# Patient Record
Sex: Female | Born: 1965 | Hispanic: No | Marital: Single | Smoking: Never smoker
Health system: Southern US, Community
[De-identification: ages and names within clinical notes are randomized; demographics above are authoritative.]

## PROBLEM LIST (undated history)

## (undated) DIAGNOSIS — F32A Depression, unspecified: Secondary | ICD-10-CM

## (undated) DIAGNOSIS — A64 Unspecified sexually transmitted disease: Secondary | ICD-10-CM

## (undated) DIAGNOSIS — F4321 Adjustment disorder with depressed mood: Secondary | ICD-10-CM

## (undated) DIAGNOSIS — T8859XA Other complications of anesthesia, initial encounter: Secondary | ICD-10-CM

## (undated) DIAGNOSIS — F419 Anxiety disorder, unspecified: Secondary | ICD-10-CM

## (undated) DIAGNOSIS — M549 Dorsalgia, unspecified: Secondary | ICD-10-CM

## (undated) DIAGNOSIS — M545 Low back pain, unspecified: Secondary | ICD-10-CM

## (undated) DIAGNOSIS — M47816 Spondylosis without myelopathy or radiculopathy, lumbar region: Secondary | ICD-10-CM

## (undated) DIAGNOSIS — G8929 Other chronic pain: Secondary | ICD-10-CM

## (undated) DIAGNOSIS — R87613 High grade squamous intraepithelial lesion on cytologic smear of cervix (HGSIL): Secondary | ICD-10-CM

## (undated) DIAGNOSIS — E559 Vitamin D deficiency, unspecified: Secondary | ICD-10-CM

## (undated) DIAGNOSIS — F329 Major depressive disorder, single episode, unspecified: Secondary | ICD-10-CM

## (undated) DIAGNOSIS — D649 Anemia, unspecified: Secondary | ICD-10-CM

## (undated) DIAGNOSIS — S52023A Displaced fracture of olecranon process without intraarticular extension of unspecified ulna, initial encounter for closed fracture: Secondary | ICD-10-CM

## (undated) DIAGNOSIS — G43009 Migraine without aura, not intractable, without status migrainosus: Secondary | ICD-10-CM

## (undated) HISTORY — DX: Migraine without aura, not intractable, without status migrainosus: G43.009

## (undated) HISTORY — DX: Major depressive disorder, single episode, unspecified: F32.9

## (undated) HISTORY — PX: INGUINAL HERNIA REPAIR: SUR1180

## (undated) HISTORY — DX: Adjustment disorder with depressed mood: F43.21

## (undated) HISTORY — DX: Unspecified sexually transmitted disease: A64

## (undated) HISTORY — DX: Depression, unspecified: F32.A

## (undated) HISTORY — DX: Vitamin D deficiency, unspecified: E55.9

---

## 2002-10-21 ENCOUNTER — Encounter: Payer: Self-pay | Admitting: Emergency Medicine

## 2002-10-21 ENCOUNTER — Emergency Department (HOSPITAL_COMMUNITY): Admission: EM | Admit: 2002-10-21 | Discharge: 2002-10-21 | Payer: Self-pay | Admitting: Emergency Medicine

## 2011-06-18 ENCOUNTER — Ambulatory Visit (INDEPENDENT_AMBULATORY_CARE_PROVIDER_SITE_OTHER): Payer: BC Managed Care – PPO

## 2011-06-18 DIAGNOSIS — S60229A Contusion of unspecified hand, initial encounter: Secondary | ICD-10-CM

## 2011-06-18 DIAGNOSIS — M25539 Pain in unspecified wrist: Secondary | ICD-10-CM

## 2011-06-27 ENCOUNTER — Ambulatory Visit (INDEPENDENT_AMBULATORY_CARE_PROVIDER_SITE_OTHER): Payer: BC Managed Care – PPO

## 2011-06-27 DIAGNOSIS — J40 Bronchitis, not specified as acute or chronic: Secondary | ICD-10-CM

## 2011-06-27 DIAGNOSIS — B9789 Other viral agents as the cause of diseases classified elsewhere: Secondary | ICD-10-CM

## 2013-10-15 DIAGNOSIS — M533 Sacrococcygeal disorders, not elsewhere classified: Secondary | ICD-10-CM | POA: Insufficient documentation

## 2013-11-05 DIAGNOSIS — G4700 Insomnia, unspecified: Secondary | ICD-10-CM | POA: Insufficient documentation

## 2013-11-05 DIAGNOSIS — M545 Low back pain, unspecified: Secondary | ICD-10-CM | POA: Insufficient documentation

## 2013-11-05 DIAGNOSIS — F329 Major depressive disorder, single episode, unspecified: Secondary | ICD-10-CM | POA: Insufficient documentation

## 2013-11-05 DIAGNOSIS — F32A Depression, unspecified: Secondary | ICD-10-CM | POA: Insufficient documentation

## 2014-02-24 DIAGNOSIS — R202 Paresthesia of skin: Secondary | ICD-10-CM

## 2014-02-24 DIAGNOSIS — L659 Nonscarring hair loss, unspecified: Secondary | ICD-10-CM | POA: Insufficient documentation

## 2014-02-24 DIAGNOSIS — R200 Anesthesia of skin: Secondary | ICD-10-CM | POA: Insufficient documentation

## 2014-02-24 DIAGNOSIS — M47816 Spondylosis without myelopathy or radiculopathy, lumbar region: Secondary | ICD-10-CM | POA: Insufficient documentation

## 2014-03-01 DIAGNOSIS — F4321 Adjustment disorder with depressed mood: Secondary | ICD-10-CM | POA: Insufficient documentation

## 2014-03-01 DIAGNOSIS — N946 Dysmenorrhea, unspecified: Secondary | ICD-10-CM | POA: Insufficient documentation

## 2014-03-01 DIAGNOSIS — M25569 Pain in unspecified knee: Secondary | ICD-10-CM | POA: Insufficient documentation

## 2014-03-01 DIAGNOSIS — G43109 Migraine with aura, not intractable, without status migrainosus: Secondary | ICD-10-CM | POA: Insufficient documentation

## 2014-03-01 DIAGNOSIS — Z7721 Contact with and (suspected) exposure to potentially hazardous body fluids: Secondary | ICD-10-CM | POA: Insufficient documentation

## 2014-03-01 DIAGNOSIS — F418 Other specified anxiety disorders: Secondary | ICD-10-CM | POA: Insufficient documentation

## 2014-03-01 DIAGNOSIS — N959 Unspecified menopausal and perimenopausal disorder: Secondary | ICD-10-CM | POA: Insufficient documentation

## 2014-12-16 ENCOUNTER — Encounter: Payer: Self-pay | Admitting: *Deleted

## 2014-12-17 ENCOUNTER — Encounter: Payer: Self-pay | Admitting: Neurology

## 2014-12-17 ENCOUNTER — Ambulatory Visit (INDEPENDENT_AMBULATORY_CARE_PROVIDER_SITE_OTHER): Payer: BC Managed Care – PPO | Admitting: Neurology

## 2014-12-17 VITALS — BP 108/78 | HR 58 | Ht 68.0 in | Wt 141.4 lb

## 2014-12-17 DIAGNOSIS — G43109 Migraine with aura, not intractable, without status migrainosus: Secondary | ICD-10-CM | POA: Diagnosis not present

## 2014-12-17 NOTE — Patient Instructions (Addendum)
1.  For severe migraines, take ibuprofen in combination with benadryl  2.  Continue zoloft  daily to see if this reduces the frequency of your headaches 3.  I recommend that you get imaging of your brain and blood vessels, either MRI and MRA head and neck or CT/A. 4.  Encouraged to get eyes checked 5.  Return to clinic as needed

## 2014-12-17 NOTE — Progress Notes (Signed)
Note sent

## 2014-12-17 NOTE — Progress Notes (Signed)
[facility] Clinic Note - Initial Visit   Date: 12/17/2014   Pamela [name] MRN: [id number] DOB: 1966/04/11   Dear Dr. Riley NearingAguiar:  Thank you for your kind referral of Pamela [name] for consultation of migraines. Although her history is well known to you, please allow us to reiterate it for the purpose of our medical record. The patient was accompanied to the clinic by self.    History of Present Illness: Pamela [name] is a 49 y.o. right-handed Caucasian female with adjustment disorder with depression and anxiety presenting for evaluation of migraines.    She has a history of migraines which started at the age of 49.  Migraines would occur 1-2 times per year. Since May 2016, she has been getting a migraine each week and lingering headaches. She has aura described as bright light and wavy lines which moves across her visual field to involve both eyes with associated vision loss.  Vision loss lasts about 20-minutes.  There is no pattern to where her headaches are located, she feels like it is the entire head. Pain is described as achy and lasts all day.  Sleep helps alleviate the pain.  She has not noticed any pattern, except the day preceding she gets whole body fatigue and neck stiffness.  She has tried aspirin when her aura starts which does not seem to help.  Headaches are not worse in the morning or with bending, coughing, or sneezing.  She had daily headaches 5 days of the week, which is mild and dull ranked 4/10.  She does not take anything for this headache.  She has no gone to the emergency department this year.  She has missed several days of work.    She had not had any previous MRI and says that she cannot afford it.  CT head in 2004 was normal.  Of note, patient is not forthcoming with her family and social history.  She does not know her drug allergies and says 'you'll need to contact my doctor, it's in their records".  Out-side paper records,  electronic medical record, and images have been reviewed where available and summarized as:  Labs 11/14/2014:  Vitamin B12 796, vitamin D 39.9  CT head 10/22/2002:  NEGATIVE NONCONTRAST HEAD CT.  Past Medical History  Diagnosis Date  . Adjustment disorder with depressed mood   . Migraine without aura and without status migrainosus, not intractable   . Vitamin D deficiency     Past Surgical History  Procedure Laterality Date  . Inguinal hernia repair       Medications:  Current Outpatient Prescriptions on File Prior to Visit  Medication Sig Dispense Refill  . ALPRAZolam (XANAX) 0.25 MG tablet Take 0.25 mg by mouth 4 (four) times daily.    Marland Kitchen. buPROPion (WELLBUTRIN XL) 300 MG 24 hr tablet Take 300 mg by mouth daily.    . cyclobenzaprine (FLEXERIL) 5 MG tablet Take 5 mg by mouth 3 (three) times daily.    . sertraline (ZOLOFT) 50 MG tablet Take 50 mg by mouth daily.    . traMADol (ULTRAM) 50 MG tablet Take by mouth every 8 (eight) hours as needed.     No current facility-administered medications on file prior to visit.    Allergies:  Unknown  Allergies  Allergen Reactions  . Codeine Sulfate   . Doxycycline Monohydrate   . Erythromycin   . Lorcet [Hydrocodone-Acetaminophen]   . Sulfa Antibiotics     Family History: Family History  Problem Relation Age  of Onset  . Migraines Mother   . Heart disease Paternal Grandfather     Social History: History  Substance Use Topics  . Smoking status: Never Smoker   . Smokeless tobacco: Never Used  . Alcohol Use: No   History   Social History Narrative   Lives alone.   She works on [employer].   Highest level of education:  Does not want to answer.       Review of Systems:  CONSTITUTIONAL: No fevers, chills, night sweats, or weight loss.   EYES: +visual changes or eye pain ENT: No hearing changes.  No history of nose bleeds.   RESPIRATORY: No cough, wheezing and shortness of breath.   CARDIOVASCULAR: Negative for chest pain,  and palpitations.   GI: Negative for abdominal discomfort, blood in stools or black stools.  No recent change in bowel habits.   GU:  No history of incontinence.   MUSCLOSKELETAL: No history of joint pain or swelling.  No myalgias.   SKIN: Negative for lesions, rash, and itching.   HEMATOLOGY/ONCOLOGY: Negative for prolonged bleeding, bruising easily, and swollen nodes.  No history of cancer.   ENDOCRINE: Negative for cold or heat intolerance, polydipsia or goiter.   PSYCH:  +depression or anxiety symptoms.   NEURO: As Above.   Vital Signs:  BP 108/78 mmHg  Pulse 58  Ht 5\' 8"  (1.727 m)  Wt 141 lb 6 oz (64.127 kg)  BMI 21.50 kg/m2  SpO2 98% Pain Scale: 0 on a scale of 0-10   General Medical Exam:   General:  Well appearing, blunted affect, comfortable.   Eyes/ENT: see cranial nerve examination.   Neck: No masses appreciated.  Full range of motion without tenderness.  No carotid bruits. Respiratory:  Clear to auscultation, good air entry bilaterally.   Cardiac:  Regular rate and rhythm, no murmur.   Extremities:  No deformities, edema, or skin discoloration.  Skin:  No rashes or lesions.  Neurological Exam: MENTAL STATUS including orientation to time, place, person, recent and remote memory, attention span and concentration, language, and fund of knowledge is normal.  Speech is not dysarthric.  She had a skeptical behavior.  CRANIAL NERVES: II:  No visual field defects.  Unremarkable fundi.   III-IV-VI: Pupils equal round and reactive to light.  Normal conjugate, extra-ocular eye movements in all directions of gaze.  No nystagmus.  No ptosis.   V:  Normal facial sensation.     VII:  Normal facial symmetry and movements.  No pathologic facial reflexes.  VIII:  Normal hearing and vestibular function.   IX-X:  Normal palatal movement.   XI:  Normal shoulder shrug and head rotation.   XII:  Normal tongue strength and range of motion, no deviation or fasciculation.  MOTOR:  No  atrophy, fasciculations or abnormal movements.  No pronator drift.  Tone is normal.    Right Upper Extremity:    Left Upper Extremity:    Deltoid  5/5   Deltoid  5/5   Biceps  5/5   Biceps  5/5   Triceps  5/5   Triceps  5/5   Wrist extensors  5/5   Wrist extensors  5/5   Wrist flexors  5/5   Wrist flexors  5/5   Finger extensors  5/5   Finger extensors  5/5   Finger flexors  5/5   Finger flexors  5/5   Dorsal interossei  5/5   Dorsal interossei  5/5   Abductor pollicis  5/5   Abductor pollicis  5/5   Tone (Ashworth scale)  0  Tone (Ashworth scale)  0   Right Lower Extremity:    Left Lower Extremity:    Hip flexors  5/5   Hip flexors  5/5   Hip extensors  5/5   Hip extensors  5/5   Knee flexors  5/5   Knee flexors  5/5   Knee extensors  5/5   Knee extensors  5/5   Dorsiflexors  5/5   Dorsiflexors  5/5   Plantarflexors  5/5   Plantarflexors  5/5   Toe extensors  5/5   Toe extensors  5/5   Toe flexors  5/5   Toe flexors  5/5   Tone (Ashworth scale)  0  Tone (Ashworth scale)  0   MSRs:  Right                                                                 Left brachioradialis 2+  brachioradialis 2+  biceps 2+  biceps 2+  triceps 2+  triceps 2+  patellar 2+  patellar 2+  ankle jerk 2+  ankle jerk 2+  Hoffman no  Hoffman no  plantar response down  plantar response down   SENSORY:  Normal and symmetric perception of light touch, pinprick, vibration, and proprioception.  Romberg's sign absent.   COORDINATION/GAIT: Normal finger-to- nose-finger and heel-to-shin.  Intact rapid alternating movements bilaterally.  Able to rise from a chair without using arms.  Gait narrow based and stable. Tandem and stressed gait intact.    IMPRESSION: Migraine with aura, episodic.  Headaches have recently increased in frequency since May 2016 without identifiable triggers.  No worrisome findings on exam or red flags by history. However, given the abrupt worsening of pain, I would recommend MRI/A brain  to evaluate for secondary cause of headache.  She does not wish to have imaging due to cost.  For headache prevention, she was started on zoloft  daily for depression, which we can also use for migraine prevention so I would like to see how she does over the next few weeks on this medication.  If there is no improvement, consider topamax or magnesium going forward. For abortive therapy, she may try using ibuprofen in combination with benadryl.  Ideally, I would like to check her vessel status prior to starting triptans.  She will call with update in 4-6 weeks.  Return to clinic as needed   The duration of this appointment visit was 45 minutes of face-to-face time with the patient.  Greater than 50% of this time was spent in counseling, explanation of diagnosis, planning of further management, and coordination of care.   Thank you for allowing me to participate in patient's care.  If I can answer any additional questions, I would be pleased to do so.    Sincerely,    Donika K. Allena Katz, DO

## 2017-05-11 ENCOUNTER — Ambulatory Visit: Payer: BC Managed Care – PPO | Admitting: Family Medicine

## 2017-05-11 ENCOUNTER — Encounter: Payer: Self-pay | Admitting: Family Medicine

## 2017-05-11 ENCOUNTER — Other Ambulatory Visit: Payer: Self-pay

## 2017-05-11 VITALS — BP 122/78 | HR 87 | Temp 99.1°F | Resp 16 | Ht 68.0 in | Wt 151.6 lb

## 2017-05-11 DIAGNOSIS — J0100 Acute maxillary sinusitis, unspecified: Secondary | ICD-10-CM

## 2017-05-11 MED ORDER — AMOXICILLIN 500 MG PO CAPS: 1000.0000 mg | ORAL_CAPSULE | Freq: Two times a day (BID) | ORAL | 0 refills | Status: DC

## 2017-05-11 MED ORDER — PROMETHAZINE-DM 6.25-15 MG/5ML PO SYRP: 5.0000 mL | ORAL_SOLUTION | Freq: Four times a day (QID) | ORAL | 0 refills | Status: DC | PRN

## 2017-05-11 MED ORDER — FLUTICASONE PROPIONATE 50 MCG/ACT NA SUSP: 2.0000 | Freq: Every day | NASAL | 2 refills | Status: DC

## 2017-05-11 NOTE — Progress Notes (Signed)
Subjective:    Patient ID: Pamela GerlachAnjela [name], female    DOB: Dec 26, 1965, 51 y.o.   MRN: [id number] Chief Complaint  Patient presents with  . Establish Care    sore throat, swollen glands, sinus pressure/pain headache since Monday     HPI 3d of illness that began with a sore throat and the folowing day glands swell then sinus pain/pressure, HA has been severe x 2d with teeth pain. Has been resting, pushing fluids. Trying alka-seltzer cold and flu without any significant relief. Has had some sweats/chills.  Getting congestion that is dark yellow. Cough with post-nasal drip. Sxs keeping her up and disturbing sleep. Has tried netti pot but seemed to be ineffective.  Past Medical History:  Diagnosis Date  . Adjustment disorder with depressed mood   . Migraine without aura and without status migrainosus, not intractable   . Vitamin D deficiency    Past Surgical History:  Procedure Laterality Date  . INGUINAL HERNIA REPAIR     Current Outpatient Medications on File Prior to Visit  Medication Sig Dispense Refill  . buPROPion (WELLBUTRIN XL) 300 MG 24 hr tablet Take 300 mg by mouth daily.    . cyclobenzaprine (FLEXERIL) 5 MG tablet Take 5 mg by mouth 3 (three) times daily.    . sertraline (ZOLOFT) 50 MG tablet Take 50 mg by mouth daily.    . traMADol (ULTRAM) 50 MG tablet Take by mouth every 8 (eight) hours as needed.     No current facility-administered medications on file prior to visit.    Allergies  Allergen Reactions  . Codeine Sulfate   . Doxycycline Monohydrate   . Erythromycin   . Lorcet [Hydrocodone-Acetaminophen]   . Sulfa Antibiotics    Family History  Problem Relation Age of Onset  . Migraines Mother   . Heart disease Paternal Grandfather    Social History   Socioeconomic History  . Marital status: Single    Spouse name: None  . Number of children: None  . Years of education: None  . Highest education level: None  Social Needs  . Financial resource strain: None    . Food insecurity - worry: None  . Food insecurity - inability: None  . Transportation needs - medical: None  . Transportation needs - non-medical: None  Occupational History  . None  Tobacco Use  . Smoking status: Never Smoker  . Smokeless tobacco: Never Used  Substance and Sexual Activity  . Alcohol use: No    Alcohol/week: 0.0 oz  . Drug use: No  . Sexual activity: None  Other Topics Concern  . None  Social History Narrative   Lives alone.   She works on [employer].   Highest level of education:  Does not want to answer.   Depression screen PHQ 2/9 05/11/2017  Decreased Interest 0  Down, Depressed, Hopeless 0  PHQ - 2 Score 0      Review of Systems See hpi    Objective:   Physical Exam  Constitutional: She is oriented to person, place, and time. She appears well-developed and well-nourished. She appears lethargic. She appears ill. No distress.  HENT:  Head: Normocephalic and atraumatic.  Right Ear: External ear and ear canal normal. Tympanic membrane is retracted. A middle ear effusion is present.  Left Ear: External ear and ear canal normal. Tympanic membrane is retracted. A middle ear effusion is present.  Nose: Mucosal edema and rhinorrhea present. Right sinus exhibits maxillary sinus tenderness. Left sinus exhibits maxillary sinus tenderness.  Mouth/Throat: Uvula is midline and mucous membranes are normal. Posterior oropharyngeal erythema present. No oropharyngeal exudate, posterior oropharyngeal edema or tonsillar abscesses.  Eyes: Conjunctivae are normal. Right eye exhibits no discharge. Left eye exhibits no discharge. No scleral icterus.  Neck: Normal range of motion. Neck supple.  Cardiovascular: Normal rate, regular rhythm, normal heart sounds and intact distal pulses.  Pulmonary/Chest: Effort normal and breath sounds normal.  Lymphadenopathy:       Head (right side): Submandibular adenopathy present. No preauricular and no posterior auricular adenopathy  present.       Head (left side): Submandibular adenopathy present. No preauricular and no posterior auricular adenopathy present.    She has no cervical adenopathy.       Right: No supraclavicular adenopathy present.       Left: No supraclavicular adenopathy present.  Neurological: She is oriented to person, place, and time. She appears lethargic.  Skin: Skin is warm and dry. She is not diaphoretic. No erythema.  Psychiatric: She has a normal mood and affect. Her behavior is normal.      BP 122/78   Pulse 87   Temp 99.1 F (37.3 C)   Resp 16   Ht 5\' 8"  (1.727 m)   Wt 151 lb 9.6 oz (68.8 kg)   SpO2 98%   BMI 23.05 kg/m      Assessment & Plan:   1. Acute non-recurrent maxillary sinusitis     Meds ordered this encounter  Medications  . amoxicillin (AMOXIL) 500 MG capsule    Sig: Take 2 capsules (1,000 mg total) by mouth 2 (two) times daily.    Dispense:  40 capsule    Refill:  0  . fluticasone (FLONASE) 50 MCG/ACT nasal spray    Sig: Place 2 sprays into both nostrils at bedtime.    Dispense:  16 g    Refill:  2  . promethazine-dextromethorphan (PROMETHAZINE-DM) 6.25-15 MG/5ML syrup    Sig: Take 5 mLs by mouth 4 (four) times daily as needed for cough.    Dispense:  118 mL    Refill:  0    Norberto SorensonEva Breland Elders, M.D.  [facility]  Why [address] [place], [place] [address] [phone] phone [phone] fax  05/17/17 9:42 AM

## 2017-05-11 NOTE — Patient Instructions (Addendum)
   IF you received an x-ray today, you will receive an invoice from [facility]. Please contact [facility] at [phone] with questions or concerns regarding your invoice.   IF you received labwork today, you will receive an invoice from [facility]. Please contact [facility] at [phone] with questions or concerns regarding your invoice.   Our billing staff will not be able to assist you with questions regarding bills from these companies.  You will be contacted with the lab results as soon as they are available. The fastest way to get your results is to activate your My Chart account. Instructions are located on the last page of this paperwork. If you have not heard from us regarding the results in 2 weeks, please contact this office.      Sinusitis, Adult Sinusitis is soreness and inflammation of your sinuses. Sinuses are hollow spaces in the bones around your face. Your sinuses are located:  Around your eyes.  In the middle of your forehead.  Behind your nose.  In your cheekbones. Your sinuses and nasal passages are lined with a stringy fluid (mucus). Mucus normally drains out of your sinuses. When your nasal tissues become inflamed or swollen, the mucus can become trapped or blocked so air cannot flow through your sinuses. This allows bacteria, viruses, and funguses to grow, which leads to infection. Sinusitis can develop quickly and last for 7?10 days (acute) or for more than 12 weeks (chronic). Sinusitis often develops after a cold. What are the causes? This condition is caused by anything that creates swelling in the sinuses or stops mucus from draining, including:  Allergies.  Asthma.  Bacterial or viral infection.  Abnormally shaped bones between the nasal passages.  Nasal growths that contain mucus (nasal polyps).  Narrow sinus openings.  Pollutants, such as chemicals or irritants in the air.  A foreign object stuck in the nose.  A fungal  infection. This is rare. What increases the risk? The following factors may make you more likely to develop this condition:  Having allergies or asthma.  Having had a recent cold or respiratory tract infection.  Having structural deformities or blockages in your nose or sinuses.  Having a weak immune system.  Doing a lot of swimming or diving.  Overusing nasal sprays.  Smoking. What are the signs or symptoms? The main symptoms of this condition are pain and a feeling of pressure around the affected sinuses. Other symptoms include:  Upper toothache.  Earache.  Headache.  Bad breath.  Decreased sense of smell and taste.  A cough that may get worse at night.  Fatigue.  Fever.  Thick drainage from your nose. The drainage is often green and it may contain pus (purulent).  Stuffy nose or congestion.  Postnasal drip. This is when extra mucus collects in the throat or back of the nose.  Swelling and warmth over the affected sinuses.  Sore throat.  Sensitivity to light. How is this diagnosed? This condition is diagnosed based on symptoms, a medical history, and a physical exam. To find out if your condition is acute or chronic, your health care provider may:  Look in your nose for signs of nasal polyps.  Tap over the affected sinus to check for signs of infection.  View the inside of your sinuses using an imaging device that has a light attached (endoscope). If your health care provider suspects that you have chronic sinusitis, you may also:  Be tested for allergies.  Have a sample of   mucus taken from your nose (nasal culture) and checked for bacteria.  Have a mucus sample examined to see if your sinusitis is related to an allergy. If your sinusitis does not respond to treatment and it lasts longer than 8 weeks, you may have an MRI or CT scan to check your sinuses. These scans also help to determine how severe your infection is. In rare cases, a bone biopsy may  be done to rule out more serious types of fungal sinus disease. How is this treated? Treatment for sinusitis depends on the cause and whether your condition is chronic or acute. If a virus is causing your sinusitis, your symptoms will go away on their own within 10 days. You may be given medicines to relieve your symptoms, including:  Topical nasal decongestants. They shrink swollen nasal passages and let mucus drain from your sinuses.  Antihistamines. These drugs block inflammation that is triggered by allergies. This can help to ease swelling in your nose and sinuses.  Topical nasal corticosteroids. These are nasal sprays that ease inflammation and swelling in your nose and sinuses.  Nasal saline washes. These rinses can help to get rid of thick mucus in your nose. If your condition is caused by bacteria, you will be given an antibiotic medicine. If your condition is caused by a fungus, you will be given an antifungal medicine. Surgery may be needed to correct underlying conditions, such as narrow nasal passages. Surgery may also be needed to remove polyps. Follow these instructions at home: Medicines   Take, use, or apply over-the-counter and prescription medicines only as told by your health care provider. These may include nasal sprays.  If you were prescribed an antibiotic medicine, take it as told by your health care provider. Do not stop taking the antibiotic even if you start to feel better. Hydrate and Humidify   Drink enough water to keep your urine clear or pale yellow. Staying hydrated will help to thin your mucus.  Use a cool mist humidifier to keep the humidity level in your home above 50%.  Inhale steam for 10-15 minutes, 3-4 times a day or as told by your health care provider. You can do this in the bathroom while a hot shower is running.  Limit your exposure to cool or dry air. Rest   Rest as much as possible.  Sleep with your head raised (elevated).  Make sure to  get enough sleep each night. General instructions   Apply a warm, moist washcloth to your face 3-4 times a day or as told by your health care provider. This will help with discomfort.  Wash your hands often with soap and water to reduce your exposure to viruses and other germs. If soap and water are not available, use hand sanitizer.  Do not smoke. Avoid being around people who are smoking (secondhand smoke).  Keep all follow-up visits as told by your health care provider. This is important. Contact a health care provider if:  You have a fever.  Your symptoms get worse.  Your symptoms do not improve within 10 days. Get help right away if:  You have a severe headache.  You have persistent vomiting.  You have pain or swelling around your face or eyes.  You have vision problems.  You develop confusion.  Your neck is stiff.  You have trouble breathing. This information is not intended to replace advice given to you by your health care provider. Make sure you discuss any questions you have with   your health care provider. Document Released: 05/16/2005 Document Revised: 01/10/2016 Document Reviewed: 03/11/2015 Elsevier Interactive Patient Education  2017 [employer]  

## 2017-08-01 ENCOUNTER — Encounter: Payer: Self-pay | Admitting: Gastroenterology

## 2017-09-11 ENCOUNTER — Ambulatory Visit (AMBULATORY_SURGERY_CENTER): Payer: Self-pay | Admitting: *Deleted

## 2017-09-11 ENCOUNTER — Other Ambulatory Visit: Payer: Self-pay

## 2017-09-11 ENCOUNTER — Encounter: Payer: Self-pay | Admitting: Gastroenterology

## 2017-09-11 VITALS — Ht 68.0 in | Wt 150.0 lb

## 2017-09-11 DIAGNOSIS — K625 Hemorrhage of anus and rectum: Secondary | ICD-10-CM

## 2017-09-11 MED ORDER — NA SULFATE-K SULFATE-MG SULF 17.5-3.13-1.6 GM/177ML PO SOLN: ORAL | 0 refills | Status: DC

## 2017-09-11 NOTE — Progress Notes (Signed)
Patient denies any allergies to eggs or soy. Patient denies any problems with anesthesia/sedation. Patient denies any oxygen use at home. Patient denies taking any diet/weight loss medications or blood thinners. EMMI education declined by the pt. Answered patient's questions during pv today.

## 2017-09-25 ENCOUNTER — Encounter: Payer: Self-pay | Admitting: Gastroenterology

## 2017-09-25 ENCOUNTER — Ambulatory Visit (AMBULATORY_SURGERY_CENTER): Payer: BC Managed Care – PPO | Admitting: Gastroenterology

## 2017-09-25 VITALS — BP 122/75 | HR 59 | Temp 96.9°F | Resp 12 | Ht 68.0 in | Wt 150.0 lb

## 2017-09-25 DIAGNOSIS — K625 Hemorrhage of anus and rectum: Secondary | ICD-10-CM | POA: Diagnosis not present

## 2017-09-25 DIAGNOSIS — K649 Unspecified hemorrhoids: Secondary | ICD-10-CM | POA: Diagnosis not present

## 2017-09-25 HISTORY — PX: COLONOSCOPY WITH PROPOFOL: SHX5780

## 2017-09-25 MED ORDER — SODIUM CHLORIDE 0.9 % IV SOLN: 500.0000 mL | Freq: Once | INTRAVENOUS | Status: DC

## 2017-09-25 NOTE — Op Note (Signed)
[facility] Patient Name: Pamela [name] Procedure Date: 09/25/2017 7:39 AM MRN: [id number] Endoscopist: Napoleon Form , MD Age: 52 Referring MD:  Date of Birth: Dec 19, 1965 Gender: Female Account #: [id number] Procedure:                Colonoscopy Indications:              Evaluation of unexplained GI bleeding Medicines:                Monitored Anesthesia Care Procedure:                Pre-Anesthesia Assessment:                           - Prior to the procedure, a History and Physical                            was performed, and patient medications and                            allergies were reviewed. The patient's tolerance of                            previous anesthesia was also reviewed. The risks                            and benefits of the procedure and the sedation                            options and risks were discussed with the patient.                            All questions were answered, and informed consent                            was obtained. Prior Anticoagulants: The patient has                            taken no previous anticoagulant or antiplatelet                            agents. ASA Grade Assessment: I - A normal, healthy                            patient. After reviewing the risks and benefits,                            the patient was deemed in satisfactory condition to                            undergo the procedure.                           After obtaining informed consent, the colonoscope  was passed under direct vision. Throughout the                            procedure, the patient's blood pressure, pulse, and                            oxygen saturations were monitored continuously. The                            Colonoscope was introduced through the anus and                            advanced to the the cecum, identified by                            appendiceal orifice and ileocecal valve.  The                            Colonoscope was introduced through the and advanced                            to the. The colonoscopy was performed without                            difficulty. The patient tolerated the procedure                            well. The quality of the bowel preparation was                            excellent. The ileocecal valve, appendiceal                            orifice, and rectum were photographed. Scope In: 8:12:48 AM Scope Out: 8:27:15 AM Scope Withdrawal Time: 0 hours 7 minutes 19 seconds  Total Procedure Duration: 0 hours 14 minutes 27 seconds  Findings:                 The perianal and digital rectal examinations were                            normal.                           Non-bleeding internal hemorrhoids were found during                            retroflexion. The hemorrhoids were small.                           The exam was otherwise without abnormality. Complications:            No immediate complications. Estimated Blood Loss:     Estimated blood loss: none. Impression:               - Non-bleeding internal hemorrhoids.                           -  The examination was otherwise normal.                           - No specimens collected. Recommendation:           - Patient has a contact number available for                            emergencies. The signs and symptoms of potential                            delayed complications were discussed with the                            patient. Return to normal activities tomorrow.                            Written discharge instructions were provided to the                            patient.                           - Resume previous diet.                           - Continue present medications.                           - Repeat colonoscopy in 10 years for screening                            purposes. Napoleon Form, MD 09/25/2017 8:32:18 AM This report has been signed  electronically.

## 2017-09-25 NOTE — Progress Notes (Signed)
Report given to PACU, vss 

## 2017-09-25 NOTE — Progress Notes (Signed)
Pt's states no medical or surgical changes since previsit or office visit. 

## 2017-09-25 NOTE — Patient Instructions (Signed)
Impression/Recommendations:  Hemorrhoid handout given to patient.  Resume previous diet. Continue present medications.  Repeat colonoscopy in 10 years for screening purposes.  YOU HAD AN ENDOSCOPIC PROCEDURE TODAY AT THE [facility]:   Refer to the procedure report that was given to you for any specific questions about what was found during the examination.  If the procedure report does not answer your questions, please call your gastroenterologist to clarify.  If you requested that your care partner not be given the details of your procedure findings, then the procedure report has been included in a sealed envelope for you to review at your convenience later.  YOU SHOULD EXPECT: Some feelings of bloating in the abdomen. Passage of more gas than usual.  Walking can help get rid of the air that was put into your GI tract during the procedure and reduce the bloating. If you had a lower endoscopy (such as a colonoscopy or flexible sigmoidoscopy) you may notice spotting of blood in your stool or on the toilet paper. If you underwent a bowel prep for your procedure, you may not have a normal bowel movement for a few days.  Please Note:  You might notice some irritation and congestion in your nose or some drainage.  This is from the oxygen used during your procedure.  There is no need for concern and it should clear up in a day or so.  SYMPTOMS TO REPORT IMMEDIATELY:   Following lower endoscopy (colonoscopy or flexible sigmoidoscopy):  Excessive amounts of blood in the stool  Significant tenderness or worsening of abdominal pains  Swelling of the abdomen that is new, acute  Fever of 100F or higher For urgent or emergent issues, a gastroenterologist can be reached at any hour by calling [phone].   DIET:  We do recommend a small meal at first, but then you may proceed to your regular diet.  Drink plenty of fluids but you should avoid alcoholic beverages for 24  hours.  ACTIVITY:  You should plan to take it easy for the rest of today and you should NOT DRIVE or use heavy machinery until tomorrow (because of the sedation medicines used during the test).    FOLLOW UP: Our staff will call the number listed on your records the next business day following your procedure to check on you and address any questions or concerns that you may have regarding the information given to you following your procedure. If we do not reach you, we will leave a message.  However, if you are feeling well and you are not experiencing any problems, there is no need to return our call.  We will assume that you have returned to your regular daily activities without incident.  If any biopsies were taken you will be contacted by phone or by letter within the next 1-3 weeks.  Please call us at [phone] if you have not heard about the biopsies in 3 weeks.    SIGNATURES/CONFIDENTIALITY: You and/or your care partner have signed paperwork which will be entered into your electronic medical record.  These signatures attest to the fact that that the information above on your After Visit Summary has been reviewed and is understood.  Full responsibility of the confidentiality of this discharge information lies with you and/or your care-partner. 

## 2017-09-26 ENCOUNTER — Telehealth: Payer: Self-pay | Admitting: *Deleted

## 2017-09-26 ENCOUNTER — Encounter: Payer: Self-pay | Admitting: Gastroenterology

## 2017-09-26 NOTE — Telephone Encounter (Signed)
No answer for post procedure call back. Will attempt to call back later this afternoon. SM 

## 2017-09-26 NOTE — Telephone Encounter (Signed)
  Follow up Call-  Call back number 09/25/2017  Post procedure Call Back phone  # [phone]  Permission to leave phone message Yes  Some recent data might be hidden     Patient questions:  Do you have a fever, pain , or abdominal swelling? No. Pain Score  0 *  Have you tolerated food without any problems? Yes.    Have you been able to return to your normal activities? Yes.    Do you have any questions about your discharge instructions: Diet   No. Medications  No. Follow up visit  No.  Do you have questions or concerns about your Care? No.  Actions: * If pain score is 4 or above: No action needed, pain <4.

## 2019-05-31 HISTORY — PX: HARDWARE REMOVAL: SHX979

## 2019-07-08 ENCOUNTER — Encounter (HOSPITAL_COMMUNITY): Payer: Self-pay

## 2019-07-08 ENCOUNTER — Other Ambulatory Visit: Payer: Self-pay

## 2019-07-08 ENCOUNTER — Emergency Department (HOSPITAL_COMMUNITY)
Admission: EM | Admit: 2019-07-08 | Discharge: 2019-07-08 | Disposition: A | Discharge status: Home / Self Care | Payer: BC Managed Care – PPO | Attending: Emergency Medicine | Admitting: Emergency Medicine

## 2019-07-08 ENCOUNTER — Emergency Department (HOSPITAL_COMMUNITY): Payer: BC Managed Care – PPO

## 2019-07-08 DIAGNOSIS — Y929 Unspecified place or not applicable: Secondary | ICD-10-CM | POA: Diagnosis not present

## 2019-07-08 DIAGNOSIS — Y999 Unspecified external cause status: Secondary | ICD-10-CM | POA: Diagnosis not present

## 2019-07-08 DIAGNOSIS — W19XXXA Unspecified fall, initial encounter: Secondary | ICD-10-CM

## 2019-07-08 DIAGNOSIS — S52092A Other fracture of upper end of left ulna, initial encounter for closed fracture: Secondary | ICD-10-CM | POA: Insufficient documentation

## 2019-07-08 DIAGNOSIS — W000XXA Fall on same level due to ice and snow, initial encounter: Secondary | ICD-10-CM | POA: Diagnosis not present

## 2019-07-08 DIAGNOSIS — Y9389 Activity, other specified: Secondary | ICD-10-CM | POA: Insufficient documentation

## 2019-07-08 DIAGNOSIS — S59902A Unspecified injury of left elbow, initial encounter: Secondary | ICD-10-CM | POA: Diagnosis present

## 2019-07-08 HISTORY — DX: Dorsalgia, unspecified: M54.9

## 2019-07-08 MED ORDER — OXYCODONE-ACETAMINOPHEN 5-325 MG PO TABS: 1.0000 | ORAL_TABLET | Freq: Four times a day (QID) | ORAL | 0 refills | Status: DC | PRN

## 2019-07-08 MED ORDER — OXYCODONE-ACETAMINOPHEN 5-325 MG PO TABS
1.0000 | ORAL_TABLET | Freq: Once | ORAL | Status: AC
  Administered 2019-07-08: 09:00:00 1 via ORAL
  Filled 2019-07-08: qty 1

## 2019-07-08 NOTE — Discharge Instructions (Addendum)
Please read and follow all provided instructions.  You have been seen today after an injury to your left elbow  Your xray shows a fracture of your olecranon We have placed you in a splint- please keep this on, keep this clean & dry and intact until you have followed up with orthopedics.  Do not put any weight on this extremity Please call Dr. Hinda Glatter office today to schedule an appointment for tomorrow.   Home care instructions: -- *PRICE in the first 24-48 hours after injury: Protect with splint Rest Ice- Do not apply ice pack directly to your splint place towel or similar between your splint and ice/ice pack. Apply ice for 20 min, then remove for 40 min while awake Compression- splint Elevate affected extremity above the level of your heart when not walking around for the first 24-48 hours   Medications:  Please take ibuprofen  or other NSAIDs per over the counter dosing to help with pain/swelling.  If your pain is not alleviated by ibuprofen please take percocet.  -Percocet-this is a narcotic/controlled substance medication that has potential addicting qualities.  We recommend that you take 1-2 tablets every 6 hours as needed for severe pain.  Do not drive or operate heavy machinery when taking this medicine as it can be sedating. Do not drink alcohol or take other sedating medications when taking this medicine for safety reasons.  Keep this out of reach of small children.  Please be aware this medicine has Tylenol in it (325 mg/tab) do not exceed the maximum dose of Tylenol in a day per over the counter recommendations should you decide to supplement with Tylenol over the counter.   We have prescribed you new medication(s) today. Discuss the medications prescribed today with your pharmacist as they can have adverse effects and interactions with your other medicines including over the counter and prescribed medications. Seek medical evaluation if you start to experience new or abnormal  symptoms after taking one of these medicines, seek care immediately if you start to experience difficulty breathing, feeling of your throat closing, facial swelling, or rash as these could be indications of a more serious allergic reaction   Follow-up instructions: Please follow-up with the orthopedic surgeon in your discharge instructions today for an appointment tomorrow.   Return instructions:  Please return if your digits or extremity are numb or tingling, appear gray or blue, or you have severe pain (also elevate the extremity and loosen splint or wrap if you were given one) Please return if you have redness or fevers.  Please return to the Emergency Department if you experience worsening symptoms.  Please return if you have any other emergent concerns. Additional Information:  Your vital signs today were: BP 129/68 (BP Location: Right Arm)   Pulse 61   Temp 99 F (37.2 C) (Oral)   Resp 16   Ht 5\' 9"  (1.753 m)   Wt 64.4 kg   LMP 06/19/2019 (Approximate)   SpO2 100%   BMI 20.97 kg/m  If your blood pressure (BP) was elevated above 135/85 this visit, please have this repeated by your doctor within one month. ---------------

## 2019-07-08 NOTE — ED Notes (Signed)
Ortho called for splint and immobilizer.

## 2019-07-08 NOTE — ED Triage Notes (Signed)
Patient states she slipped on ice on a sidewalk and landed on her left elbow.

## 2019-07-08 NOTE — ED Provider Notes (Signed)
[facility]-EMERGENCY DEPT Provider Note   CSN: [id number] Arrival date & time: 07/08/19  0725     History Chief Complaint  Patient presents with  . Fall  . Elbow Injury    Pamela [name] is a 54 y.o. female with a history of migraines, depression, anxiety, and adjustment disorder who presents to the emergency department status post mechanical fall with complaints of left elbow pain.  Patient states this morning about 40 minutes prior to arrival she slipped on some ice and fell directly onto her left elbow.  She denies head injury or loss of consciousness.  Reports isolated injury to the left elbow, states pain is severe, worse with any attempts to move the elbow, no alleviating factors.  Denies numbness, tingling, weakness, open wounds, neck pain, back pain, chest pain, or abdominal pain.  Patient is right-hand dominant.  HPI     Past Medical History:  Diagnosis Date  . Adjustment disorder with depressed mood   . Back pain   . Depression   . Migraine without aura and without status migrainosus, not intractable   . Vitamin D deficiency     Patient Active Problem List   Diagnosis Date Noted  . Migraine with aura and without status migrainosus, not intractable 03/01/2014  . Adjustment disorder with depressed mood 03/01/2014  . Dysmenorrhea 03/01/2014  . Exposure to potentially hazardous body fluids 03/01/2014  . Joint pain, knee 03/01/2014  . Menopausal and perimenopausal disorder 03/01/2014  . Situational anxiety 03/01/2014  . Hair loss 02/24/2014  . Numbness and tingling of leg 02/24/2014  . Depression 11/05/2013  . Low back pain 11/05/2013  . Insomnia 11/05/2013  . 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 INGUINAL HERNIA REPAIR       OB History   No obstetric history on file.     Family History  Problem Relation Age of Onset  . Migraines Mother   . Heart disease Paternal Grandfather     . Colon cancer Neg Hx   . Esophageal cancer Neg Hx   . Stomach cancer Neg Hx     Social History   Tobacco Use  . Smoking status: Never Smoker  . Smokeless tobacco: Never Used  Substance Use Topics  . Alcohol use: No    Alcohol/week: 0.0 standard drinks  . Drug use: No    Home Medications Prior to Admission medications   Medication Sig Start Date End Date Taking? Authorizing Provider  Aspirin-Acetaminophen-Caffeine (EXCEDRIN MIGRAINE PO) Take 2 tablets by mouth daily as needed.    [provider]  Aspirin-Salicylamide-Caffeine (BC HEADACHE POWDER PO) Take 1 Dose by mouth as needed.    [provider]  BLACK CURRANT SEED OIL PO Take 2 each by mouth daily.    [provider]  buPROPion (WELLBUTRIN XL) 300 MG 24 hr tablet Take 300 mg by mouth daily.    [provider]  traMADol (ULTRAM) 50 MG tablet Take by mouth every 8 (eight) hours as needed.    [provider]  zolpidem (AMBIEN) 10 MG tablet Take 1 tablet by mouth at bedtime as needed. 11/02/16   [provider]    Allergies    Codeine, Codeine sulfate, Doxycycline monohydrate, Erythromycin, Hydrocodone-acetaminophen, Lorcet [hydrocodone-acetaminophen], Other, Sulfa antibiotics, and Sulfasalazine  Review of Systems   Review of Systems  Constitutional: Negative for chills and fever.  Respiratory: Negative for shortness of breath.   Cardiovascular: Negative for chest pain.  Gastrointestinal: Negative for abdominal pain, nausea and vomiting.  Musculoskeletal: Positive for arthralgias.  Skin: Negative for wound.  Neurological: Negative for weakness and numbness.    Physical Exam Updated Vital Signs BP 129/68 (BP Location: Right Arm)   Pulse 61   Temp 99 F (37.2 C) (Oral)   Resp 16   Ht 5\' 9"  (1.753 m)   Wt 64.4 kg   LMP 06/19/2019 (Approximate)   SpO2 100%   BMI 20.97 kg/m   Physical Exam Vitals and nursing note reviewed.  Constitutional:      General: She is  not in acute distress.    Appearance: Normal appearance. She is well-developed. She is not ill-appearing or toxic-appearing.  HENT:     Head: Normocephalic and atraumatic. No raccoon eyes or Battle's sign.     Right Ear: No hemotympanum.     Left Ear: No hemotympanum.  Eyes:     General:        Right eye: No discharge.        Left eye: No discharge.     Conjunctiva/sclera: Conjunctivae normal.     Pupils: Pupils are equal, round, and reactive to light.  Neck:     Comments: No midline tenderness.  Cardiovascular:     Rate and Rhythm: Normal rate and regular rhythm.     Pulses:          Radial pulses are 2+ on the right side and 2+ on the left side.     Heart sounds: No murmur.  Pulmonary:     Effort: No respiratory distress.     Breath sounds: Normal breath sounds. No wheezing or rales.  Chest:     Chest wall: No tenderness.  Abdominal:     General: There is no distension.     Palpations: Abdomen is soft.     Tenderness: There is no abdominal tenderness.  Musculoskeletal:     Cervical back: Normal range of motion and neck supple. No spinous process tenderness.     Comments: Upper extremities: Left elbow is swollen.  No open wounds.  Patient has intact active range of motion throughout the upper extremities with the exception of left elbow limitation with flexion, held mostly in extension.  Patient is tender to palpation over the left posterior elbow, especially over the olecranon, upper extremities are otherwise nontender.  Neurovascular intact distally. Back: No midline tenderness.  Lower extremities: Intact active range of motion.  No point/focal bony tenderness.  Skin:    General: Skin is warm and dry.     Capillary Refill: Capillary refill takes less than 2 seconds.     Findings: No rash.  Neurological:     Mental Status: She is alert.     Comments: Alert. Clear speech. Sensation grossly intact to bilateral upper extremities. 5/5 symmetric grip strength. Ambulatory.     Psychiatric:        Mood and Affect: Mood normal.        Behavior: Behavior normal.     ED Results / Procedures / Treatments   Labs (all labs ordered are listed, but only abnormal results are displayed) Labs Reviewed - No data to display  EKG None  Radiology DG Elbow Complete Left  Result Date: 07/08/2019 CLINICAL DATA:  Pain following fall EXAM: LEFT ELBOW - COMPLETE 3+ VIEW COMPARISON:  None. FINDINGS: Frontal, lateral, and bilateral oblique views were obtained. There is a comminuted fracture of the proximal ulna. There is separation of fracture fragments at the level of  the proximal ulnar metaphysis with approximately 1 cm of separation of fracture fragments in this area. The proximal fracture fragment is somewhat rotated. There is no frank dislocation. There is hemarthrosis. There is no appreciable joint space narrowing or erosion. IMPRESSION: Comminuted, displaced fracture of the proximal ulna with hemarthrosis. No frank dislocation or appreciable underlying arthropathy. Electronically Signed   By: Lowella Grip III M.D.   On: 07/08/2019 08:11    Procedures Procedures (including critical care time)  Medications Ordered in ED Medications  oxyCODONE-acetaminophen (PERCOCET/ROXICET) 5-325 MG per tablet 1 tablet (1 tablet Oral Given 07/08/19 9629)    ED Course  I have reviewed the triage vital signs and the nursing notes.  Pertinent labs & imaging results that were available during my care of the patient were reviewed by me and considered in my medical decision making (see chart for details).    MDM Rules/Calculators/A&P                      Patient presents s/p mechanical fall. Appears to have isolated injury to the R elbow- xray reveals comminuted and displaced fracture of the proximal ulna with hemarthrosis.  Patient neurovascularly intact distally. No open wounds. Discussed with orthopedic surgeon Dr. Alvan Dame, recommends posterior long-arm splint with sling, will review  images and call back with plan.  09:53: CONSULT: Re-dicussed w/ Dr. Alvan Dame who has discussed with Dr. Jeannie Fend- plan for splint, sling, see Dr. Jeannie Fend in clinic tomorrow, likely will need surgical intervention. Appreciate consultation.   Plan carried out as discussed.  Will discharge home with instructions for PRICE as well as percocet PRN. I discussed results, treatment plan, need for follow-up, and return precautions with the patient. Provided opportunity for questions, patient confirmed understanding and is in agreement with plan.   Findings and plan of care discussed with supervising physician Dr. Stark Jock who is in agreement.   Final Clinical Impression(s) / ED Diagnoses Final diagnoses:  Fall, initial encounter  Closed comminuted fracture of proximal end of left ulna, initial encounter    Rx / DC Orders ED Discharge Orders         Ordered    oxyCODONE-acetaminophen (PERCOCET/ROXICET) 5-325 MG tablet  Every 6 hours PRN     07/08/19 1000           Amaryllis Dyke, PA-C 07/08/19 1006    Veryl Speak, MD 07/08/19 1341

## 2019-07-08 NOTE — ED Notes (Signed)
Pt verbalizes understanding of DC instructions. Pt belongings returned and is ambulatory out of ED.  

## 2019-07-09 ENCOUNTER — Other Ambulatory Visit: Payer: Self-pay

## 2019-07-09 ENCOUNTER — Encounter (HOSPITAL_BASED_OUTPATIENT_CLINIC_OR_DEPARTMENT_OTHER): Payer: Self-pay | Admitting: Orthopaedic Surgery

## 2019-07-09 ENCOUNTER — Other Ambulatory Visit (HOSPITAL_COMMUNITY): Payer: BC Managed Care – PPO

## 2019-07-09 ENCOUNTER — Ambulatory Visit
Admission: RE | Admit: 2019-07-09 | Discharge: 2019-07-09 | Disposition: A | Discharge status: Home / Self Care | Payer: BC Managed Care – PPO | Source: Ambulatory Visit | Attending: Orthopaedic Surgery | Admitting: Orthopaedic Surgery

## 2019-07-09 ENCOUNTER — Other Ambulatory Visit (HOSPITAL_COMMUNITY)
Admission: RE | Admit: 2019-07-09 | Discharge: 2019-07-09 | Disposition: A | Discharge status: Home / Self Care | Payer: BC Managed Care – PPO | Source: Ambulatory Visit | Attending: Orthopaedic Surgery | Admitting: Orthopaedic Surgery

## 2019-07-09 ENCOUNTER — Other Ambulatory Visit: Payer: Self-pay | Admitting: Orthopaedic Surgery

## 2019-07-09 DIAGNOSIS — Z20822 Contact with and (suspected) exposure to covid-19: Secondary | ICD-10-CM | POA: Insufficient documentation

## 2019-07-09 DIAGNOSIS — Z01812 Encounter for preprocedural laboratory examination: Secondary | ICD-10-CM | POA: Diagnosis present

## 2019-07-09 DIAGNOSIS — M25522 Pain in left elbow: Secondary | ICD-10-CM

## 2019-07-09 DIAGNOSIS — S52023A Displaced fracture of olecranon process without intraarticular extension of unspecified ulna, initial encounter for closed fracture: Secondary | ICD-10-CM | POA: Insufficient documentation

## 2019-07-09 LAB — SARS CORONAVIRUS 2 (TAT 6-24 HRS): SARS Coronavirus 2: NEGATIVE

## 2019-07-09 NOTE — H&P (Signed)
PREOPERATIVE H&P  Chief Complaint: LEFT OLECRANON FRACTURE OF ELBOW  HPI: Pamela [name] is a 54 y.o. female who is scheduled for OPEN REDUCTION INTERNAL FIXATION (ORIF) LEFT ELBOW/OLECRANON FRACTURE.   This is a 54 year old otherwise healthy female who slipped and fell on the ice and landed on her left elbow.  She had pain immediately and was unable to extend her elbow.  This happened on 07/08/2019 at 7 a.m.  She is right hand dominant baseline.  She works as an Consulting civil engineer support person.    Her symptoms are rated as moderate to severe, and have been worsening.  This is significantly impairing activities of daily living.    Please see clinic note for further details on this patient's care.    She has elected for surgical management.   Past Medical History:  Diagnosis Date  . Adjustment disorder with depressed mood   . Back pain   . Depression   . Migraine without aura and without status migrainosus, not intractable   . Vitamin D deficiency    Past Surgical History:  Procedure Laterality Date  . INGUINAL HERNIA REPAIR     Social History   Socioeconomic History  . Marital status: Single    Spouse name: Not on file  . Number of children: Not on file  . Years of education: Not on file  . Highest education level: Not on file  Occupational History  . Not on file  Tobacco Use  . Smoking status: Never Smoker  . Smokeless tobacco: Never Used  Substance and Sexual Activity  . Alcohol use: No    Alcohol/week: 0.0 standard drinks  . Drug use: No  . Sexual activity: Not on file  Other Topics Concern  . Not on file  Social History Narrative   Lives alone.   She works on Arts administrator.   Highest level of education:  Does not want to answer.   Social Determinants of Health   Financial Resource Strain:   . Difficulty of Paying Living Expenses: Not on file  Food Insecurity:   . Worried About Programme researcher, broadcasting/film/video in the Last Year: Not on file  . Ran Out of Food in the Last Year: Not  on file  Transportation Needs:   . Lack of Transportation (Medical): Not on file  . Lack of Transportation (Non-Medical): Not on file  Physical Activity:   . Days of Exercise per Week: Not on file  . Minutes of Exercise per Session: Not on file  Stress:   . Feeling of Stress : Not on file  Social Connections:   . Frequency of Communication with Friends and Family: Not on file  . Frequency of Social Gatherings with Friends and Family: Not on file  . Attends Religious Services: Not on file  . Active Member of Clubs or Organizations: Not on file  . Attends Banker Meetings: Not on file  . Marital Status: Not on file   Family History  Problem Relation Age of Onset  . Migraines Mother   . Heart disease Paternal Grandfather   . Colon cancer Neg Hx   . Esophageal cancer Neg Hx   . Stomach cancer Neg Hx    Allergies  Allergen Reactions  . Codeine   . Codeine Sulfate   . Doxycycline Monohydrate   . Erythromycin   . Hydrocodone-Acetaminophen   . Lorcet [Hydrocodone-Acetaminophen]   . Other     Cycline-250=hives  . Sulfa Antibiotics   . Sulfasalazine  Prior to Admission medications   Medication Sig Start Date End Date Taking? Authorizing Provider  Aspirin-Acetaminophen-Caffeine (EXCEDRIN MIGRAINE PO) Take 2 tablets by mouth daily as needed.    [provider]  Aspirin-Salicylamide-Caffeine (BC HEADACHE POWDER PO) Take 1 Dose by mouth as needed.    [provider]  BLACK CURRANT SEED OIL PO Take 2 each by mouth daily.    [provider]  buPROPion (WELLBUTRIN XL) 300 MG 24 hr tablet Take 300 mg by mouth daily.    [provider]  oxyCODONE-acetaminophen (PERCOCET/ROXICET) 5-325 MG tablet Take 1-2 tablets by mouth every 6 (six) hours as needed for severe pain. 07/08/19   Petrucelli, Samantha R, PA-C  traMADol (ULTRAM) 50 MG tablet Take by mouth every 8 (eight) hours as needed.    [provider]  zolpidem (AMBIEN) 10 MG tablet  Take 1 tablet by mouth at bedtime as needed. 11/02/16   [provider]    ROS: All other systems have been reviewed and were otherwise negative with the exception of those mentioned in the HPI and as above.  Physical Exam: General: Alert, no acute distress Cardiovascular: No pedal edema Respiratory: No cyanosis, no use of accessory musculature GI: No organomegaly, abdomen is soft and non-tender Skin: No lesions in the area of chief complaint Neurologic: Sensation intact distally Psychiatric: Patient is competent for consent with normal mood and affect Lymphatic: No axillary or cervical lymphadenopathy  MUSCULOSKELETAL:  Left elbow: well fitting splint.  Skin appears to be intact.  Distal motor and sensory function is intact.  Warm and well perfused extremity distally.    Imaging: X-rays of left elbow demonstrate a comminuted proximal ulna fracture with likely extension distal to the olecranon fracture itself.  The joint is well reduced.    Assessment: LEFT OLECRANON FRACTURE OF ELBOW  Plan: Plan for Procedure(s): OPEN REDUCTION INTERNAL FIXATION (ORIF) LEFT ELBOW/OLECRANON FRACTURE  The risks benefits and alternatives were discussed with the patient including but not limited to the risks of nonoperative treatment, versus surgical intervention including infection, bleeding, nerve injury,  blood clots, cardiopulmonary complications, morbidity, mortality, among others, and they were willing to proceed.   The patient acknowledged the explanation, agreed to proceed with the plan and consent was signed.   Operative Plan: ORIF left olecranon fracture Discharge Medications: Standard DVT Prophylaxis: None Physical Therapy: Outpatient PT Special Discharge needs: Splint, Sling for comfort   Ethelda Chick, PA-C  07/09/2019 2:37 PM

## 2019-07-10 ENCOUNTER — Ambulatory Visit (HOSPITAL_BASED_OUTPATIENT_CLINIC_OR_DEPARTMENT_OTHER): Payer: BC Managed Care – PPO | Admitting: Certified Registered Nurse Anesthetist

## 2019-07-10 ENCOUNTER — Encounter (HOSPITAL_BASED_OUTPATIENT_CLINIC_OR_DEPARTMENT_OTHER): Payer: Self-pay | Admitting: Orthopaedic Surgery

## 2019-07-10 ENCOUNTER — Ambulatory Visit (HOSPITAL_COMMUNITY): Payer: BC Managed Care – PPO

## 2019-07-10 ENCOUNTER — Encounter (HOSPITAL_BASED_OUTPATIENT_CLINIC_OR_DEPARTMENT_OTHER)
Admission: RE | Disposition: A | Discharge status: Home / Self Care | Payer: Self-pay | Source: Home / Self Care | Attending: Orthopaedic Surgery

## 2019-07-10 ENCOUNTER — Ambulatory Visit (HOSPITAL_BASED_OUTPATIENT_CLINIC_OR_DEPARTMENT_OTHER)
Admission: RE | Admit: 2019-07-10 | Discharge: 2019-07-10 | Disposition: A | Discharge status: Home / Self Care | Payer: BC Managed Care – PPO | Attending: Orthopaedic Surgery | Admitting: Orthopaedic Surgery

## 2019-07-10 ENCOUNTER — Other Ambulatory Visit: Payer: Self-pay

## 2019-07-10 DIAGNOSIS — S52032A Displaced fracture of olecranon process with intraarticular extension of left ulna, initial encounter for closed fracture: Secondary | ICD-10-CM | POA: Diagnosis present

## 2019-07-10 DIAGNOSIS — F329 Major depressive disorder, single episode, unspecified: Secondary | ICD-10-CM | POA: Insufficient documentation

## 2019-07-10 DIAGNOSIS — S52002A Unspecified fracture of upper end of left ulna, initial encounter for closed fracture: Secondary | ICD-10-CM | POA: Diagnosis not present

## 2019-07-10 DIAGNOSIS — G43909 Migraine, unspecified, not intractable, without status migrainosus: Secondary | ICD-10-CM | POA: Insufficient documentation

## 2019-07-10 DIAGNOSIS — Z7982 Long term (current) use of aspirin: Secondary | ICD-10-CM | POA: Insufficient documentation

## 2019-07-10 DIAGNOSIS — Z885 Allergy status to narcotic agent status: Secondary | ICD-10-CM | POA: Insufficient documentation

## 2019-07-10 DIAGNOSIS — Z79899 Other long term (current) drug therapy: Secondary | ICD-10-CM | POA: Insufficient documentation

## 2019-07-10 DIAGNOSIS — Z882 Allergy status to sulfonamides status: Secondary | ICD-10-CM | POA: Insufficient documentation

## 2019-07-10 DIAGNOSIS — Z881 Allergy status to other antibiotic agents status: Secondary | ICD-10-CM | POA: Diagnosis not present

## 2019-07-10 DIAGNOSIS — W000XXA Fall on same level due to ice and snow, initial encounter: Secondary | ICD-10-CM | POA: Insufficient documentation

## 2019-07-10 DIAGNOSIS — Z09 Encounter for follow-up examination after completed treatment for conditions other than malignant neoplasm: Secondary | ICD-10-CM

## 2019-07-10 DIAGNOSIS — Z419 Encounter for procedure for purposes other than remedying health state, unspecified: Secondary | ICD-10-CM

## 2019-07-10 HISTORY — DX: Displaced fracture of olecranon process without intraarticular extension of unspecified ulna, initial encounter for closed fracture: S52.023A

## 2019-07-10 HISTORY — PX: ORIF ELBOW FRACTURE: SHX5031

## 2019-07-10 SURGERY — OPEN REDUCTION INTERNAL FIXATION (ORIF) ELBOW/OLECRANON FRACTURE
Anesthesia: Regional | Site: Elbow | Laterality: Left

## 2019-07-10 MED ORDER — ONDANSETRON HCL 4 MG/2ML IJ SOLN
INTRAMUSCULAR | Status: AC
  Filled 2019-07-10: qty 2

## 2019-07-10 MED ORDER — ACETAMINOPHEN 500 MG PO TABS: 1000.0000 mg | ORAL_TABLET | Freq: Three times a day (TID) | ORAL | 0 refills | Status: AC

## 2019-07-10 MED ORDER — MIDAZOLAM HCL 2 MG/2ML IJ SOLN
INTRAMUSCULAR | Status: AC
  Filled 2019-07-10: qty 2

## 2019-07-10 MED ORDER — CEFAZOLIN SODIUM-DEXTROSE 2-4 GM/100ML-% IV SOLN
INTRAVENOUS | Status: AC
  Filled 2019-07-10: qty 100

## 2019-07-10 MED ORDER — FENTANYL CITRATE (PF) 100 MCG/2ML IJ SOLN
INTRAMUSCULAR | Status: AC
  Filled 2019-07-10: qty 2

## 2019-07-10 MED ORDER — PROPOFOL 500 MG/50ML IV EMUL
INTRAVENOUS | Status: DC | PRN
  Administered 2019-07-10: 75 ug/kg/min via INTRAVENOUS

## 2019-07-10 MED ORDER — LIDOCAINE 2% (20 MG/ML) 5 ML SYRINGE
INTRAMUSCULAR | Status: AC
  Filled 2019-07-10: qty 5

## 2019-07-10 MED ORDER — OMEPRAZOLE 20 MG PO CPDR: 20.0000 mg | DELAYED_RELEASE_CAPSULE | Freq: Every day | ORAL | 0 refills | Status: DC

## 2019-07-10 MED ORDER — PROPOFOL 10 MG/ML IV BOLUS
INTRAVENOUS | Status: DC | PRN
  Administered 2019-07-10: 30 mg via INTRAVENOUS
  Administered 2019-07-10: 20 mg via INTRAVENOUS

## 2019-07-10 MED ORDER — OXYCODONE HCL 5 MG PO TABS: ORAL_TABLET | ORAL | 0 refills | Status: AC

## 2019-07-10 MED ORDER — SUGAMMADEX SODIUM 500 MG/5ML IV SOLN
INTRAVENOUS | Status: AC
  Filled 2019-07-10: qty 5

## 2019-07-10 MED ORDER — CHLORHEXIDINE GLUCONATE 4 % EX LIQD: 60.0000 mL | Freq: Once | CUTANEOUS | Status: DC

## 2019-07-10 MED ORDER — BUPIVACAINE-EPINEPHRINE (PF) 0.5% -1:200000 IJ SOLN
INTRAMUSCULAR | Status: DC | PRN
  Administered 2019-07-10: 30 mL via PERINEURAL

## 2019-07-10 MED ORDER — MELOXICAM 7.5 MG PO TABS: 7.5000 mg | ORAL_TABLET | Freq: Every day | ORAL | 2 refills | Status: AC

## 2019-07-10 MED ORDER — ONDANSETRON HCL 4 MG/2ML IJ SOLN
INTRAMUSCULAR | Status: DC | PRN
  Administered 2019-07-10: 4 mg via INTRAVENOUS

## 2019-07-10 MED ORDER — CEFAZOLIN SODIUM-DEXTROSE 2-4 GM/100ML-% IV SOLN
2.0000 g | INTRAVENOUS | Status: AC
  Administered 2019-07-10: 14:00:00 2 g via INTRAVENOUS

## 2019-07-10 MED ORDER — ONDANSETRON HCL 4 MG PO TABS: 4.0000 mg | ORAL_TABLET | Freq: Three times a day (TID) | ORAL | 1 refills | Status: AC | PRN

## 2019-07-10 MED ORDER — PHENYLEPHRINE 40 MCG/ML (10ML) SYRINGE FOR IV PUSH (FOR BLOOD PRESSURE SUPPORT)
PREFILLED_SYRINGE | INTRAVENOUS | Status: AC
  Filled 2019-07-10: qty 10

## 2019-07-10 MED ORDER — VANCOMYCIN HCL 1000 MG IV SOLR
INTRAVENOUS | Status: AC
  Filled 2019-07-10: qty 1000

## 2019-07-10 MED ORDER — LIDOCAINE 2% (20 MG/ML) 5 ML SYRINGE
INTRAMUSCULAR | Status: DC | PRN
  Administered 2019-07-10: 60 mg via INTRAVENOUS

## 2019-07-10 MED ORDER — PHENYLEPHRINE 40 MCG/ML (10ML) SYRINGE FOR IV PUSH (FOR BLOOD PRESSURE SUPPORT)
PREFILLED_SYRINGE | INTRAVENOUS | Status: DC | PRN
  Administered 2019-07-10 (×4): 80 ug via INTRAVENOUS

## 2019-07-10 MED ORDER — VANCOMYCIN HCL 1000 MG IV SOLR
INTRAVENOUS | Status: DC | PRN
  Administered 2019-07-10: 1000 mg

## 2019-07-10 MED ORDER — LACTATED RINGERS IV SOLN: INTRAVENOUS | Status: DC

## 2019-07-10 MED ORDER — MIDAZOLAM HCL 2 MG/2ML IJ SOLN
1.0000 mg | INTRAMUSCULAR | Status: DC | PRN
  Administered 2019-07-10: 2 mg via INTRAVENOUS

## 2019-07-10 MED ORDER — FENTANYL CITRATE (PF) 100 MCG/2ML IJ SOLN
50.0000 ug | INTRAMUSCULAR | Status: DC | PRN
  Administered 2019-07-10: 100 ug via INTRAVENOUS

## 2019-07-10 MED ORDER — ROCURONIUM BROMIDE 10 MG/ML (PF) SYRINGE
PREFILLED_SYRINGE | INTRAVENOUS | Status: AC
  Filled 2019-07-10: qty 10

## 2019-07-10 MED ORDER — KETOROLAC TROMETHAMINE 30 MG/ML IJ SOLN
INTRAMUSCULAR | Status: AC
  Filled 2019-07-10: qty 1

## 2019-07-10 MED ORDER — PROPOFOL 500 MG/50ML IV EMUL
INTRAVENOUS | Status: AC
  Filled 2019-07-10: qty 50

## 2019-07-10 SURGICAL SUPPLY — 99 items
BENZOIN TINCTURE PRP APPL 2/3 (GAUZE/BANDAGES/DRESSINGS) ×3 IMPLANT
BIT DRILL 1.8 CANN MAX VPC (BIT) ×3 IMPLANT
BIT DRILL 2.5X2.75 QC CALB (BIT) ×3 IMPLANT
BIT DRILL CALIBRATED 2.7 (BIT) ×2 IMPLANT
BIT DRILL CALIBRATED 2.7MM (BIT) ×1
BLADE HEX COATED 2.75 (ELECTRODE) ×3 IMPLANT
BLADE SURG 10 STRL SS (BLADE) ×3 IMPLANT
BLADE SURG 15 STRL LF DISP TIS (BLADE) ×1 IMPLANT
BLADE SURG 15 STRL SS (BLADE) ×2
BNDG ELASTIC 4X5.8 VLCR STR LF (GAUZE/BANDAGES/DRESSINGS) ×3 IMPLANT
BNDG ESMARK 4X9 LF (GAUZE/BANDAGES/DRESSINGS) ×3 IMPLANT
CHLORAPREP W/TINT 26 (MISCELLANEOUS) ×3 IMPLANT
CLOSURE STERI-STRIP 1/2X4 (GAUZE/BANDAGES/DRESSINGS) ×2
CLSR STERI-STRIP ANTIMIC 1/2X4 (GAUZE/BANDAGES/DRESSINGS) ×4 IMPLANT
COVER MAYO STAND STRL (DRAPES) ×3 IMPLANT
COVER WAND RF STERILE (DRAPES) IMPLANT
CUFF TOURN SGL QUICK 18X3 (MISCELLANEOUS) ×3 IMPLANT
CUFF TOURN SGL QUICK 24 (TOURNIQUET CUFF)
CUFF TRNQT CYL 24X4X16.5-23 (TOURNIQUET CUFF) IMPLANT
DECANTER SPIKE VIAL GLASS SM (MISCELLANEOUS) IMPLANT
DRAPE C-ARM 42X72 X-RAY (DRAPES) ×3 IMPLANT
DRAPE C-ARMOR (DRAPES) ×3 IMPLANT
DRAPE EXTREMITY T 121X128X90 (DISPOSABLE) ×3 IMPLANT
DRAPE IMP U-DRAPE 54X76 (DRAPES) ×3 IMPLANT
DRAPE INCISE IOBAN 66X45 STRL (DRAPES) ×3 IMPLANT
DRAPE OEC MINIVIEW 54X84 (DRAPES) IMPLANT
DRAPE STERI 35X30 U-POUCH (DRAPES) ×3 IMPLANT
DRAPE U-SHAPE 47X51 STRL (DRAPES) ×3 IMPLANT
ELECT REM PT RETURN 9FT ADLT (ELECTROSURGICAL) ×3
ELECTRODE REM PT RTRN 9FT ADLT (ELECTROSURGICAL) ×1 IMPLANT
EXT HOSE W/PLC CONNECTION (MISCELLANEOUS) ×3
EXTENSION HOSE W/PLC CONNECTON (MISCELLANEOUS) ×1 IMPLANT
GAUZE SPONGE 4X4 12PLY STRL (GAUZE/BANDAGES/DRESSINGS) ×3 IMPLANT
GAUZE XEROFORM 1X8 LF (GAUZE/BANDAGES/DRESSINGS) ×3 IMPLANT
GLOVE BIO SURGEON STRL SZ 6.5 (GLOVE) ×10 IMPLANT
GLOVE BIO SURGEONS STRL SZ 6.5 (GLOVE) ×5
GLOVE BIOGEL PI IND STRL 6.5 (GLOVE) ×1 IMPLANT
GLOVE BIOGEL PI IND STRL 7.0 (GLOVE) ×2 IMPLANT
GLOVE BIOGEL PI IND STRL 8 (GLOVE) ×1 IMPLANT
GLOVE BIOGEL PI INDICATOR 6.5 (GLOVE) ×2
GLOVE BIOGEL PI INDICATOR 7.0 (GLOVE) ×4
GLOVE BIOGEL PI INDICATOR 8 (GLOVE) ×2
GLOVE ECLIPSE 8.0 STRL XLNG CF (GLOVE) ×3 IMPLANT
GOWN STRL REUS W/ TWL LRG LVL3 (GOWN DISPOSABLE) ×1 IMPLANT
GOWN STRL REUS W/TWL LRG LVL3 (GOWN DISPOSABLE) ×2
GOWN STRL REUS W/TWL XL LVL3 (GOWN DISPOSABLE) ×3 IMPLANT
K-WIRE ACE 1.6X6 (WIRE) ×9
K-WIRE COCR 0.9X95 (WIRE) ×3
KWIRE ACE 1.6X6 (WIRE) ×3 IMPLANT
KWIRE COCR 0.9X95 (WIRE) ×1 IMPLANT
LOOP VESSEL MAXI BLUE (MISCELLANEOUS) IMPLANT
NS IRRIG 1000ML POUR BTL (IV SOLUTION) ×3 IMPLANT
PACK ARTHROSCOPY DSU (CUSTOM PROCEDURE TRAY) ×3 IMPLANT
PACK BASIN DAY SURGERY FS (CUSTOM PROCEDURE TRAY) ×3 IMPLANT
PAD CAST 4YDX4 CTTN HI CHSV (CAST SUPPLIES) ×1 IMPLANT
PADDING CAST COTTON 4X4 STRL (CAST SUPPLIES) ×2
PENCIL SMOKE EVACUATOR (MISCELLANEOUS) ×3 IMPLANT
PLATE LONG OLECRANON LEFT (Plate) ×3 IMPLANT
SCREW CORT T15 24X3.5XST LCK (Screw) ×1 IMPLANT
SCREW CORTICAL 3.5X24MM (Screw) ×2 IMPLANT
SCREW CORTICAL LOW PROF 3.5X20 (Screw) ×3 IMPLANT
SCREW LOCK CORT STAR 3.5X12 (Screw) ×3 IMPLANT
SCREW LOCK CORT STAR 3.5X20 (Screw) ×6 IMPLANT
SCREW LOW PROFILE 18MMX3.5MM (Screw) ×6 IMPLANT
SCREW MAX VPC 2.5MMX26MM (Screw) ×3 IMPLANT
SCREW NON LOCKING LP 3.5 16MM (Screw) ×3 IMPLANT
SHEET MEDIUM DRAPE 40X70 STRL (DRAPES) ×3 IMPLANT
SLEEVE SCD COMPRESS KNEE MED (MISCELLANEOUS) ×3 IMPLANT
SLING ARM FOAM STRAP LRG (SOFTGOODS) IMPLANT
SLING ARM FOAM STRAP MED (SOFTGOODS) ×3 IMPLANT
SPLINT FAST PLASTER 5X30 (CAST SUPPLIES) ×20
SPLINT PLASTER CAST FAST 5X30 (CAST SUPPLIES) ×10 IMPLANT
SPONGE LAP 18X18 RF (DISPOSABLE) ×3 IMPLANT
STAPLER VISISTAT 35W (STAPLE) IMPLANT
SUCTION FRAZIER HANDLE 10FR (MISCELLANEOUS)
SUCTION TUBE FRAZIER 10FR DISP (MISCELLANEOUS) IMPLANT
SUT FIBERWIRE #5 38 CONV NDL (SUTURE) ×3
SUT MAXBRAID #2 CVD NDL (SUTURE) ×3 IMPLANT
SUT MNCRL AB 3-0 PS2 18 (SUTURE) ×3 IMPLANT
SUT MON AB 4-0 PS1 27 (SUTURE) ×3 IMPLANT
SUT VIC AB 0 CT1 27 (SUTURE) ×2
SUT VIC AB 0 CT1 27XBRD ANBCTR (SUTURE) ×1 IMPLANT
SUT VIC AB 1 CT1 27 (SUTURE)
SUT VIC AB 1 CT1 27XBRD ANBCTR (SUTURE) IMPLANT
SUT VIC AB 2-0 CT1 27 (SUTURE)
SUT VIC AB 2-0 CT1 TAPERPNT 27 (SUTURE) IMPLANT
SUT VIC AB 2-0 SH 27 (SUTURE) ×2
SUT VIC AB 2-0 SH 27XBRD (SUTURE) ×1 IMPLANT
SUT VIC AB 3-0 SH 27 (SUTURE) ×2
SUT VIC AB 3-0 SH 27X BRD (SUTURE) ×1 IMPLANT
SUTURE FIBERWR #5 38 CONV NDL (SUTURE) ×1 IMPLANT
SUTURE TAPE 1.3 40 TPR END (SUTURE) ×1 IMPLANT
SUTURETAPE 1.3 40 TPR END (SUTURE) ×3
SYR BULB EAR ULCER 3OZ GRN STR (SYRINGE) ×3 IMPLANT
TOWEL GREEN STERILE FF (TOWEL DISPOSABLE) ×3 IMPLANT
TUBE CONNECTING 20'X1/4 (TUBING) ×1
TUBE CONNECTING 20X1/4 (TUBING) ×2 IMPLANT
WASHER 3.5MM (Orthopedic Implant) ×6 IMPLANT
YANKAUER SUCT BULB TIP NO VENT (SUCTIONS) ×3 IMPLANT

## 2019-07-10 NOTE — Transfer of Care (Signed)
Immediate Anesthesia Transfer of Care Note  Patient: Pamela [name]  Procedure(s) Performed: OPEN REDUCTION INTERNAL FIXATION (ORIF) LEFT ELBOW/OLECRANON FRACTURE (Left Elbow)  Patient Location: PACU  Anesthesia Type:MAC combined with regional for post-op pain  Level of Consciousness: awake, alert  and oriented  Airway & Oxygen Therapy: Patient Spontanous Breathing and Patient connected to face mask oxygen  Post-op Assessment: Report given to RN and Post -op Vital signs reviewed and stable  Post vital signs: Reviewed and stable  Last Vitals:  Vitals Value Taken Time  BP    Temp    Pulse 67 07/10/19 1558  Resp 16 07/10/19 1558  SpO2 100 % 07/10/19 1558    Last Pain:  Vitals:   07/10/19 1307  TempSrc: Oral  PainSc: 0-No pain      Patients Stated Pain Goal: 4 (30/09/23 3007)  Complications: No apparent anesthesia complications

## 2019-07-10 NOTE — Anesthesia Preprocedure Evaluation (Signed)
Anesthesia Evaluation  Patient identified by MRN, date of birth, ID band Patient awake    Reviewed: Allergy & Precautions, NPO status , Patient's Chart, lab work & pertinent test results  Airway Mallampati: I  TM Distance: >3 FB Neck ROM: Full    Dental   Pulmonary    Pulmonary exam normal        Cardiovascular Normal cardiovascular exam     Neuro/Psych Anxiety Depression    GI/Hepatic   Endo/Other    Renal/GU      Musculoskeletal   Abdominal   Peds  Hematology   Anesthesia Other Findings   Reproductive/Obstetrics                             Anesthesia Physical Anesthesia Plan  ASA: II  Anesthesia Plan: Regional   Post-op Pain Management:    Induction: Intravenous  PONV Risk Score and Plan: 2 and Ondansetron and Midazolam  Airway Management Planned: Nasal Cannula  Additional Equipment:   Intra-op Plan:   Post-operative Plan:   Informed Consent: I have reviewed the patients History and Physical, chart, labs and discussed the procedure including the risks, benefits and alternatives for the proposed anesthesia with the patient or authorized representative who has indicated his/her understanding and acceptance.       Plan Discussed with: CRNA and Surgeon  Anesthesia Plan Comments:         Anesthesia Quick Evaluation

## 2019-07-10 NOTE — Anesthesia Procedure Notes (Signed)
Anesthesia Regional Block: Supraclavicular block   Pre-Anesthetic Checklist: ,, timeout performed, Correct Patient, Correct Site, Correct Laterality, Correct Procedure, Correct Position, site marked, Risks and benefits discussed,  Surgical consent,  Pre-op evaluation,  At surgeon's request and post-op pain management  Laterality: Left  Prep: chloraprep       Needles:   Needle Type: Echogenic Stimulator Needle     Needle Length: 9cm  Needle Gauge: 21     Additional Needles:   Procedures:, nerve stimulator,,,,,,,   Nerve Stimulator or Paresthesia:  Response: 0.4 mA,   Additional Responses:   Narrative:  Start time: 07/10/2019 1:18 PM End time: 07/10/2019 1:28 PM Injection made incrementally with aspirations every 5 mL.  Performed by: Personally  Anesthesiologist: Arta Bruce, MD  Additional Notes: Monitors applied. Patient sedated. Sterile prep and drape,hand hygiene and sterile gloves were used. Relevant anatomy identified.Needle position confirmed.Local anesthetic injected incrementally after negative aspiration. Local anesthetic spread visualized around nerve(s). Vascular puncture avoided. No complications. Image printed for medical record.The patient tolerated the procedure well.

## 2019-07-10 NOTE — Discharge Instructions (Signed)

## 2019-07-10 NOTE — Anesthesia Postprocedure Evaluation (Signed)
Anesthesia Post Note  Patient: Pamela [name]  Procedure(s) Performed: OPEN REDUCTION INTERNAL FIXATION (ORIF) LEFT ELBOW/OLECRANON FRACTURE (Left Elbow)     Patient location during evaluation: PACU Anesthesia Type: Regional Level of consciousness: awake and alert and patient cooperative Pain management: pain level controlled Vital Signs Assessment: post-procedure vital signs reviewed and stable Respiratory status: spontaneous breathing and respiratory function stable Cardiovascular status: stable Anesthetic complications: no    Last Vitals:  Vitals:   07/10/19 1335 07/10/19 1558  BP: 126/70 (!) 104/48  Pulse: 82 67  Resp: (!) 21 16  Temp:  36.8 C  SpO2: 100% 100%    Last Pain:  Vitals:   07/10/19 1558  TempSrc:   PainSc: 0-No pain                 Donatello Kleve DAVID

## 2019-07-10 NOTE — Progress Notes (Addendum)
Assisted Dr. Ossey with left, ultrasound guided, supraclavicular block. Side rails up, monitors on throughout procedure. See vital signs in flow sheet. Tolerated Procedure well. 

## 2019-07-10 NOTE — Interval H&P Note (Signed)
History and Physical Interval Note:  07/10/2019 1:32 PM  Pamela [name]  has presented today for surgery, with the diagnosis of LEFT OLECRANON FRACTURE OF ELBOW.  The various methods of treatment have been discussed with the patient and family. After consideration of risks, benefits and other options for treatment, the patient has consented to  Procedure(s): OPEN REDUCTION INTERNAL FIXATION (ORIF) LEFT ELBOW/OLECRANON FRACTURE (Left) as a surgical intervention.  The patient's history has been reviewed, patient examined, no change in status, stable for surgery.  I have reviewed the patient's chart and labs.  Questions were answered to the patient's satisfaction.     Bjorn Pippin

## 2019-07-10 NOTE — Op Note (Signed)
Orthopaedic Surgery Operative Note (CSN: [id number])  Pamela [name]  1966/01/16 Date of Surgery: 07/10/2019   Diagnoses:  Left olecranon and proximal ulna fracture  Procedure: Left olecranon ORIF Left proximal ulnar shaft ORIF   Operative Finding Successful completion of the planned procedure.  Patient's fracture involving both the olecranon with a sagittal split of the articular surface made this very complex reconstruction.  Additionally she had an ulnar shaft fracture and we bridged.  This was a complex injury we had a satisfactory joint reduction and good fixation.  We did backup her fixation with a Krakw tricep stitch to prevent excess tension on the olecranon fragments.  This was tied to the plate.  Post-operative plan: The patient will be discharged home with a splint.  The patient will be nonweightbearing and we will block flexion past 90 degrees for the first 2 weeks and then open the brace completely.  We will start therapy after 2 weeks.  DVT prophylaxis not indicated in this ambulatory upper extremity patient without significant risk factors.   Pain control with PRN pain medication preferring oral medicines.  Follow up plan will be scheduled in approximately 7 days for incision check and XR.  Post-Op Diagnosis: Same Surgeons:Primary: Hiram Gash, MD Assistants:Caroline McBane PA-C Location: [facility] OR ROOM 1 Anesthesia: Regional plus sedation Antibiotics: Ancef 2 g with local vancomycin powder 1 g at the surgical site Tourniquet time: 90 Estimated Blood Loss: Minimal Complications: None Specimens: None Implants: Implant Name Type Inv. Item Serial No. Manufacturer Lot No. LRB No. Used Action  PLATE LONG OLECRANON LEFT - [id number] Plate PLATE LONG OLECRANON LEFT  [employer]ON(ORTH,TRAU,BIO,SG) [id number] Left 1 Implanted  SCREW LOCK CORT STAR 3.5X12 - [id number] Screw SCREW LOCK CORT STAR 3.5X12  [employer]ON(ORTH,TRAU,BIO,SG) [id number] Left 1 Implanted  SCREW LOCK CORT STAR  3.5X20 - [id number] Screw SCREW LOCK CORT STAR 3.5X20  ZIMMER [employer]ON(ORTH,TRAU,BIO,SG) [id number] Left 2 Implanted  WASHER 3.5MM - [id number] Orthopedic Implant WASHER 3.5MM  [employer]MER [employer]ON(ORTH,TRAU,BIO,SG) [id number] Left 2 Implanted  SCREW NON LOCKING LP 3.5 16MM - [id number] Screw SCREW NON LOCKING LP 3.5 16MM  [employer]ON(ORTH,TRAU,BIO,SG) [id number] Left 1 Implanted  SCREW LOW PROFILE 18MMX3.5MM - [id number] Screw SCREW LOW PROFILE 18MMX3.5MM  ZIMMER [employer]ON(ORTH,TRAU,BIO,SG) [id number] Left 2 Implanted  SCREW CORTICAL LOW PROF 3.5X20 - [id number] Screw SCREW CORTICAL LOW PROF 3.5X20  ZIMMER [employer]ON(ORTH,TRAU,BIO,SG) [id number] Left 1 Implanted  SCREW CORTICAL 3.5X24MM - [id number] Screw SCREW CORTICAL 3.5X24MM  ZIMMER [employer]ON(ORTH,TRAU,BIO,SG) [id number] Left 1 Implanted    Indications for Surgery:   Pamela [name] is a 54 y.o. female with fall on ice resulting in complex comminuted ulna and olecranon fracture.  Benefits and risks of operative and nonoperative management were discussed prior to surgery with patient/guardian(s) and informed consent form was completed.  Specific risks including infection, need for additional surgery, nonunion, malunion, hardware prominence, failure of hardware and stiffness.   Procedure:   The patient was identified properly. Informed consent was obtained and the surgical site was marked. The patient was taken up to suite where general anesthesia was induced.  The patient was positioned lateral on a beanbag with arm over a bolster.  The left elbow was prepped and draped in the usual sterile fashion.  Timeout was performed before the beginning of the case.  Tourniquet was used for the above duration.  Began a longitudinal approach to the ulna incising skin sharply achieving hemostasis with meds.  We identified the triceps fascia and opened this region full-thickness skin flaps laterally and less so medially.  We stayed away from the ulnar nerve throughout the reduction procedure  using blunt retractors protected.  At that point we encountered the comminuted olecranon fracture with significant hematoma interposed.  We cleared this with a rondure and a curette.  That point we were able to note that there was a sagittal split.  We used a point-to-point reduction clamp and placed multiple K wires from lateral to medial not perforating the medial surface to hold this reduction.  The far medial surface of the olecranon was fragmented and the medial capsular structures were attached to these small comminuted fragments we felt that suture fixation of this would be reasonable.  We had a anatomic reduction of the joint though the cortical keys were suboptimal due to some comminution.  We reduced the olecranon to the proximal ulna and noted the proximal ulnar shaft fracture extended about 10 cm distal to the joint.  We felt that bridging this long oblique fracture would be reasonable.  We selected a Biomet Alps reconstruction plate that spanned her fracture.  We placed it under fluoroscopic guidance after achieving an anatomic reduction of the joint and were able to place 4 locking screws in the proximal olecranon fracture and then bridged the ulna fracture with multiple nonlocking screws.  We had good fixation in the shaft.  We felt that the joint was anatomic however we worried that the fixation of the olecranon itself was rather tenuous and so we performed a Krakw style suture in the distal triceps tendon and were able to tie this avoiding capturing the joint to the plate to allow stress from the triceps to be transferred to the suture in addition to the through our fracture.  We additionally used a nonabsorbable heavy suture to repair the medialmost portion of the olecranon capsule which had comminuted fragments attached mostly to close the capsule itself as well as provide reduction of these fragments.  Were happy with her final construct was stable to range of motion to 90 degrees.  Final  fluoroscopic images demonstrated anatomic reduction of the fracture in stable alignment of the overall ulna.  We irrigated the wound copiously before placing local antibiotic as listed above.  Close the incision in a multilayer fashion with absorbable suture.  Sterile dressing was placed.  A well molded long-arm splint was placed.  Patient was awoken taken to PACU in stable condition.  Alfonse Alpers, PA-C, present and scrubbed throughout the case, critical for completion in a timely fashion, and for retraction, instrumentation, closure.

## 2019-07-11 ENCOUNTER — Encounter: Payer: Self-pay | Admitting: *Deleted

## 2019-07-11 NOTE — Progress Notes (Signed)
"  discharging"patient from system. Patient left yesterday but not taken out of system by RN.

## 2020-01-21 ENCOUNTER — Other Ambulatory Visit: Payer: Self-pay | Admitting: Orthopaedic Surgery

## 2020-01-21 DIAGNOSIS — M25522 Pain in left elbow: Secondary | ICD-10-CM

## 2020-01-27 ENCOUNTER — Other Ambulatory Visit: Payer: Self-pay | Admitting: Orthopaedic Surgery

## 2020-01-27 DIAGNOSIS — M25522 Pain in left elbow: Secondary | ICD-10-CM

## 2020-01-30 ENCOUNTER — Inpatient Hospital Stay: Admission: RE | Admit: 2020-01-30 | Payer: BC Managed Care – PPO | Source: Ambulatory Visit

## 2020-02-10 ENCOUNTER — Other Ambulatory Visit: Payer: Self-pay

## 2020-02-10 ENCOUNTER — Ambulatory Visit
Admission: RE | Admit: 2020-02-10 | Discharge: 2020-02-10 | Disposition: A | Discharge status: Home / Self Care | Payer: BC Managed Care – PPO | Source: Ambulatory Visit | Attending: Orthopaedic Surgery | Admitting: Orthopaedic Surgery

## 2020-02-10 DIAGNOSIS — M25522 Pain in left elbow: Secondary | ICD-10-CM

## 2021-09-07 DIAGNOSIS — R87619 Unspecified abnormal cytological findings in specimens from cervix uteri: Secondary | ICD-10-CM | POA: Insufficient documentation

## 2021-09-22 ENCOUNTER — Encounter: Payer: Self-pay | Admitting: Obstetrics and Gynecology

## 2021-09-22 ENCOUNTER — Ambulatory Visit: Payer: BC Managed Care – PPO | Admitting: Obstetrics and Gynecology

## 2021-09-22 ENCOUNTER — Other Ambulatory Visit (HOSPITAL_COMMUNITY)
Admission: RE | Admit: 2021-09-22 | Discharge: 2021-09-22 | Disposition: A | Discharge status: Home / Self Care | Payer: BC Managed Care – PPO | Source: Ambulatory Visit | Attending: Obstetrics and Gynecology | Admitting: Obstetrics and Gynecology

## 2021-09-22 VITALS — BP 132/68 | HR 72 | Ht 68.5 in | Wt 138.8 lb

## 2021-09-22 DIAGNOSIS — A630 Anogenital (venereal) warts: Secondary | ICD-10-CM | POA: Diagnosis not present

## 2021-09-22 DIAGNOSIS — Z113 Encounter for screening for infections with a predominantly sexual mode of transmission: Secondary | ICD-10-CM

## 2021-09-22 DIAGNOSIS — R87613 High grade squamous intraepithelial lesion on cytologic smear of cervix (HGSIL): Secondary | ICD-10-CM
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
## 2021-09-22 NOTE — Patient Instructions (Signed)

## 2021-09-23 ENCOUNTER — Encounter: Payer: Self-pay | Admitting: Obstetrics and Gynecology

## 2021-09-23 LAB — SURESWAB CT/NG/T. VAGINALIS
C. trachomatis RNA, TMA: NOT DETECTED
N. gonorrhoeae RNA, TMA: NOT DETECTED
Trichomonas vaginalis RNA: NOT DETECTED

## 2021-09-28 ENCOUNTER — Telehealth: Payer: Self-pay

## 2021-09-28 LAB — SURGICAL PATHOLOGY

## 2021-09-28 NOTE — Telephone Encounter (Signed)
Pt notified and voiced understanding re: colposcopy results/recommendations. Pt states she would prefer outpatient surgery center for LEEP procedure due to pain experienced with colpo in office.  ?

## 2021-09-28 NOTE — Telephone Encounter (Signed)
Surgery:  leep with colposcopy ,  ? ?Diagnosis:  HSIL ? ?Location: [facility] ? ?Status: Outpatient ? ?Time: 30 Minutes ? ?Assistant: N/A ? ?Urgency: At Patient's Convenience ? ?Pre-Op Appointment: To Be Scheduled ? ?Post-Op Appointment(s): 4 Weeks,  ? ?Time Out Of Work: Day Of Surgery, 1 Day Post Op ? ?

## 2021-09-30 ENCOUNTER — Encounter: Payer: Self-pay | Admitting: Obstetrics and Gynecology

## 2021-09-30 NOTE — Telephone Encounter (Signed)
Pamela Archambeau "Mcgath"  P [facility] (supporting Romualdo Bolk, MD) 4 hours ago (1:13 PM)  ? ?AI ?I got a phone call about the results of the biopsy on May 2 and was told a scheduler would call me.  I haven't heard anything and was wondering if I'd be contacted about scheduling for the leep procedure after the May 10th follow up. If I have an approximate timeline how things are lining up I will feel better about having to wait. Thank you! ? ? ?Routing to [facility] for benefits.  ?

## 2021-09-30 NOTE — Telephone Encounter (Signed)
See telephone encounter dated 09/28/21.  ? ?Encounter closed.  ?
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
## 2021-10-01 NOTE — Telephone Encounter (Signed)
Spoke with patient. Surgery date request confirmed.  ?Advised surgery is scheduled for 10/19/21, [facility] at 1100.  ?Surgery instruction sheet and hospital brochure reviewed, printed copy will be mailed.  ? ?Patient notified of PR for surgery.  ? ? ?Patient verbalizes understanding and is agreeable.  ?Routing to provider. Encounter closed.  ? ?Cc: Hayley C ? ?

## 2021-10-06 ENCOUNTER — Encounter: Payer: Self-pay | Admitting: Obstetrics and Gynecology

## 2021-10-06 ENCOUNTER — Ambulatory Visit (INDEPENDENT_AMBULATORY_CARE_PROVIDER_SITE_OTHER): Payer: BC Managed Care – PPO | Admitting: Obstetrics and Gynecology

## 2021-10-06 VITALS — BP 122/74 | HR 72 | Ht 68.0 in | Wt 139.0 lb

## 2021-10-06 DIAGNOSIS — R87613 High grade squamous intraepithelial lesion on cytologic smear of cervix (HGSIL): Secondary | ICD-10-CM

## 2021-10-06 NOTE — H&P (View-Only) (Signed)
GYNECOLOGY  VISIT ?  ?HPI: ?56 y.o.   Single Other or two or more races Not Hispanic or Latino  female   ?G0P0000 with No LMP recorded.   ?here for preoperative visit for a loop cone. ?08/24/21 pap with HSIL, +HPV 18 ? ?Colposcopy on 09/22/21 was unsatisfactory. ?A. CERVIX, 9 O'CLOCK, BIOPSY:  ?-  Low grade squamous intraepithelial lesion (CIN1, mild dysplasia)  ?-  See comment  ?B. CERVIX, 12 O'CLOCK, BIOPSY:  ?-  Low grade squamous intraepithelial lesion (CIN1, mild dysplasia)  ?-  See comment  ?C. ENDOCERVIX, CURETTAGE:  ?-  High-grade squamous intraepithelial lesion (CIN 2-3; moderate to  ?severe dysplasia)  ?-  See comment  ?COMMENT:  ?A and B.  P16 immunohistochemistry shows patchy weak positivity,  ?supporting the absence of high-grade dysplasia.  ?C.  P16 immunohistochemistry shows strong blocklike positivity and the  ?proliferative rate by Ki-67 is increased, supporting the diagnosis of  ?high-grade dysplasia.  ? ?GYNECOLOGIC HISTORY: ?No LMP recorded. ?Contraception:abstinence ?Menopausal hormone therapy: No ?       ?OB History   ? ? Gravida  ?0  ? Para  ?0  ? Term  ?0  ? Preterm  ?0  ? AB  ?0  ? Living  ?0  ?  ? ? SAB  ?0  ? IAB  ?0  ? Ectopic  ?0  ? Multiple  ?0  ? Live Births  ?0  ?   ?  ?  ?    ? ?Patient Active Problem List  ? Diagnosis Date Noted  ? Abnormal cervical Papanicolaou smear 09/07/2021  ? Fracture of olecranon process of ulna 07/09/2019  ? Migraine with aura and without status migrainosus, not intractable 03/01/2014  ? Adjustment disorder with depressed mood 03/01/2014  ? Dysmenorrhea 03/01/2014  ? Exposure to potentially hazardous body fluids 03/01/2014  ? Joint pain, knee 03/01/2014  ? Menopausal and perimenopausal disorder 03/01/2014  ? Situational anxiety 03/01/2014  ? Hair loss 02/24/2014  ? Numbness and tingling of leg 02/24/2014  ? Facet syndrome, lumbar 02/24/2014  ? Depression 11/05/2013  ? Low back pain 11/05/2013  ? Insomnia 11/05/2013  ? 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 ? ?Past Medical History:  ?Diagnosis Date  ? Adjustment disorder with depressed mood   ? Back pain   ? Depression   ? Migraine without aura and without status migrainosus, not intractable   ? Olecranon fracture   ? left  ? STD (sexually transmitted disease)   ? HPV  ? Vitamin D deficiency   ? ? ?Past Surgical History:  ?Procedure Laterality Date  ? INGUINAL HERNIA REPAIR    ? ORIF ELBOW FRACTURE Left 07/10/2019  ? Procedure: OPEN REDUCTION INTERNAL FIXATION (ORIF) LEFT ELBOW/OLECRANON FRACTURE;  Surgeon: Varkey, Dax T, MD;  Location: [facility];  Service: Orthopedics;  Laterality: Left;  ? ? ?Current Outpatient Medications  ?Medication Sig Dispense Refill  ? Aspirin-Acetaminophen-Caffeine (EXCEDRIN MIGRAINE PO) Take 2 tablets by mouth daily as needed.    ? Aspirin-Salicylamide-Caffeine (BC HEADACHE POWDER PO) Take 1 Dose by mouth as needed.    ? buPROPion (WELLBUTRIN XL) 300 MG 24 hr tablet Take 150 mg by mouth daily.     ? sertraline (ZOLOFT) 100 MG tablet Take 1 tablet by mouth daily.    ? zolpidem (AMBIEN) 10 MG tablet Take 1 tablet by mouth at bedtime as needed.    ? ?Current Facility-Administered Medications  ?Medication Dose Route Frequency Provider Last Rate Last Admin  ?   0.9 %  sodium chloride infusion  500 mL Intravenous Once Nandigam, Kavitha V, MD      ?  ? ?ALLERGIES: Doxycycline monohydrate, Erythromycin, Other, Sulfa antibiotics, and Sulfasalazine ? ?Family History  ?Problem Relation Age of Onset  ? Migraines Mother   ? Heart disease Paternal Grandfather   ? Colon cancer Neg Hx   ? Esophageal cancer Neg Hx   ? Stomach cancer Neg Hx   ? ? ?Social History  ? ?Socioeconomic History  ? Marital status: Single  ?  Spouse name: Not on file  ? Number of children: Not on file  ? Years of education: Not on file  ? Highest education level: Not on file  ?Occupational History  ? Not on file  ?Tobacco Use  ? Smoking status: Never  ? Smokeless tobacco: Never  ?Vaping Use  ? Vaping Use: Never used   ?Substance and Sexual Activity  ? Alcohol use: No  ?  Alcohol/week: 0.0 standard drinks  ? Drug use: No  ? Sexual activity: Not Currently  ?  Birth control/protection: Abstinence  ?Other Topics Concern  ? Not on file  ?Social History Narrative  ? Lives alone.  ? She works on computers.  ? Highest level of education:  Does not want to answer.  ? ?Social Determinants of Health  ? ?Financial Resource Strain: Not on file  ?Food Insecurity: Not on file  ?Transportation Needs: Not on file  ?Physical Activity: Not on file  ?Stress: Not on file  ?Social Connections: Not on file  ?Intimate Partner Violence: Not on file  ? ? ?Review of Systems  ?All other systems reviewed and are negative. ? ?PHYSICAL EXAMINATION:   ? ?There were no vitals taken for this visit.    ?General appearance: alert, cooperative and appears stated age ?Neck: no adenopathy, supple, symmetrical, trachea midline and thyroid normal to inspection and palpation ?Heart: regular rate and rhythm ?Lungs: CTAB ?Abdomen: soft, non-tender; bowel sounds normal; no masses,  no organomegaly ?Extremities: normal, atraumatic, no cyanosis ?Skin: normal color, texture and turgor, no rashes or lesions ?Lymph: normal cervical supraclavicular and inguinal nodes ?Neurologic: grossly normal ? 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 Reviewed risks, including but not limited to: bleeding, infection, injury to nearby organs, risk of persistent dysplasia, need for further surgery ?The patient cannot tolerate an office procedure, will do this in the OR.  ? ?Addendum: the patient reviewed her chart and informed us that she is not living alone.  ?

## 2021-10-06 NOTE — Progress Notes (Addendum)
GYNECOLOGY  VISIT ?  ?HPI: ?56 y.o.   Single Other or two or more races Not Hispanic or Latino  female   ?G0P0000 with No LMP recorded.   ?here for preoperative visit for a loop cone. ?08/24/21 pap with HSIL, +HPV 18 ? ?Colposcopy on 09/22/21 was unsatisfactory. ?A. CERVIX, 9 O'CLOCK, BIOPSY:  ?-  Low grade squamous intraepithelial lesion (CIN1, mild dysplasia)  ?-  See comment  ?B. CERVIX, 12 O'CLOCK, BIOPSY:  ?-  Low grade squamous intraepithelial lesion (CIN1, mild dysplasia)  ?-  See comment  ?C. ENDOCERVIX, CURETTAGE:  ?-  High-grade squamous intraepithelial lesion (CIN 2-3; moderate to  ?severe dysplasia)  ?-  See comment  ?COMMENT:  ?A and B.  P16 immunohistochemistry shows patchy weak positivity,  ?supporting the absence of high-grade dysplasia.  ?C.  P16 immunohistochemistry shows strong blocklike positivity and the  ?proliferative rate by Ki-67 is increased, supporting the diagnosis of  ?high-grade dysplasia.  ? ?GYNECOLOGIC HISTORY: ?No LMP recorded. ?Contraception:abstinence ?Menopausal hormone therapy: No ?       ?OB History   ? ? Gravida  ?0  ? Para  ?0  ? Term  ?0  ? Preterm  ?0  ? AB  ?0  ? Living  ?0  ?  ? ? SAB  ?0  ? IAB  ?0  ? Ectopic  ?0  ? Multiple  ?0  ? Live Births  ?0  ?   ?  ?  ?    ? ?Patient Active Problem List  ? Diagnosis Date Noted  ? Abnormal cervical Papanicolaou smear 09/07/2021  ? Fracture of olecranon process of ulna 07/09/2019  ? Migraine with aura and without status migrainosus, not intractable 03/01/2014  ? Adjustment disorder with depressed mood 03/01/2014  ? Dysmenorrhea 03/01/2014  ? Exposure to potentially hazardous body fluids 03/01/2014  ? Joint pain, knee 03/01/2014  ? Menopausal and perimenopausal disorder 03/01/2014  ? Situational anxiety 03/01/2014  ? Hair loss 02/24/2014  ? Numbness and tingling of leg 02/24/2014  ? Facet syndrome, lumbar 02/24/2014  ? Depression 11/05/2013  ? Low back pain 11/05/2013  ? Insomnia 11/05/2013  ? 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 ? ?Past Medical History:  ?Diagnosis Date  ? Adjustment disorder with depressed mood   ? Back pain   ? Depression   ? Migraine without aura and without status migrainosus, not intractable   ? Olecranon fracture   ? left  ? STD (sexually transmitted disease)   ? HPV  ? Vitamin D deficiency   ? ? ?Past Surgical History:  ?Procedure Laterality Date  ? INGUINAL HERNIA REPAIR    ? ORIF ELBOW FRACTURE Left 07/10/2019  ? Procedure: OPEN REDUCTION INTERNAL FIXATION (ORIF) LEFT ELBOW/OLECRANON FRACTURE;  Surgeon: Hiram Gash, MD;  Location: [facility];  Service: Orthopedics;  Laterality: Left;  ? ? ?Current Outpatient Medications  ?Medication Sig Dispense Refill  ? Aspirin-Acetaminophen-Caffeine (EXCEDRIN MIGRAINE PO) Take 2 tablets by mouth daily as needed.    ? Aspirin-Salicylamide-Caffeine (BC HEADACHE POWDER PO) Take 1 Dose by mouth as needed.    ? buPROPion (WELLBUTRIN XL) 300 MG 24 hr tablet Take 150 mg by mouth daily.     ? sertraline (ZOLOFT) 100 MG tablet Take 1 tablet by mouth daily.    ? zolpidem (AMBIEN) 10 MG tablet Take 1 tablet by mouth at bedtime as needed.    ? ?Current Facility-Administered Medications  ?Medication Dose Route Frequency Provider Last Rate Last Admin  ?  0.9 %  sodium chloride infusion  500 mL Intravenous Once Nandigam, Kavitha V, MD      ?  ? ?ALLERGIES: Doxycycline monohydrate, Erythromycin, Other, Sulfa antibiotics, and Sulfasalazine ? ?Family History  ?Problem Relation Age of Onset  ? Migraines Mother   ? Heart disease Paternal Grandfather   ? Colon cancer Neg Hx   ? Esophageal cancer Neg Hx   ? Stomach cancer Neg Hx   ? ? ?Social History  ? ?Socioeconomic History  ? Marital status: Single  ?  Spouse name: Not on file  ? Number of children: Not on file  ? Years of education: Not on file  ? Highest education level: Not on file  ?Occupational History  ? Not on file  ?Tobacco Use  ? Smoking status: Never  ? Smokeless tobacco: Never  ?Vaping Use  ? Vaping Use: Never used   ?Substance and Sexual Activity  ? Alcohol use: No  ?  Alcohol/week: 0.0 standard drinks  ? Drug use: No  ? Sexual activity: Not Currently  ?  Birth control/protection: Abstinence  ?Other Topics Concern  ? Not on file  ?Social History Narrative  ? Lives alone.  ? She works on Radio producer.  ? Highest level of education:  Does not want to answer.  ? ?Social Determinants of Health  ? ?Financial Resource Strain: Not on file  ?Food Insecurity: Not on file  ?Transportation Needs: Not on file  ?Physical Activity: Not on file  ?Stress: Not on file  ?Social Connections: Not on file  ?Intimate Partner Violence: Not on file  ? ? ?Review of Systems  ?All other systems reviewed and are negative. ? ?PHYSICAL EXAMINATION:   ? ?There were no vitals taken for this visit.    ?General appearance: alert, cooperative and appears stated age ?Neck: no adenopathy, supple, symmetrical, trachea midline and thyroid normal to inspection and palpation ?Heart: regular rate and rhythm ?Lungs: CTAB ?Abdomen: soft, non-tender; bowel sounds normal; no masses,  no organomegaly ?Extremities: normal, atraumatic, no cyanosis ?Skin: normal color, texture and turgor, no rashes or lesions ?Lymph: normal cervical supraclavicular and inguinal nodes ?Neurologic: grossly normal ? 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 Reviewed risks, including but not limited to: bleeding, infection, injury to nearby organs, risk of persistent dysplasia, need for further surgery ?The patient cannot tolerate an office procedure, will do this in the OR.  ? ?Addendum: the patient reviewed her chart and informed us that she is not living alone.  ?

## 2021-10-07 ENCOUNTER — Encounter: Payer: Self-pay | Admitting: Obstetrics and Gynecology

## 2021-10-07 NOTE — Telephone Encounter (Signed)
This was part of her past history that self populated. Please figure out how to fix it going forward and let the patient know we can add an addendum to her note, but can't change the note.  ?

## 2021-10-07 NOTE — Telephone Encounter (Signed)
See MyChart message to patient.  ? ?Routing to Dr. Shirley Friar.  ? ?Encounter closed.  ?

## 2021-10-12 NOTE — Telephone Encounter (Signed)
Call to patient.  ?Patient was inquiring about request to have no make providers or staff in OR during surgery. Advised I did reach out to [facility] and was advised " I have let the team at [facility] know and we will do our best to have zero Female providers.  We have discussed with pre-op, OR, and PACU." ? ?Questions answered and patient thankful for call.  ? ?Routing to Dr. Rosann Auerbach.  ? ?Encounter closed.  ? ?

## 2021-10-15 ENCOUNTER — Encounter (HOSPITAL_BASED_OUTPATIENT_CLINIC_OR_DEPARTMENT_OTHER): Payer: Self-pay | Admitting: Obstetrics and Gynecology

## 2021-10-18 ENCOUNTER — Encounter (HOSPITAL_BASED_OUTPATIENT_CLINIC_OR_DEPARTMENT_OTHER): Payer: Self-pay | Admitting: Obstetrics and Gynecology

## 2021-10-18 ENCOUNTER — Other Ambulatory Visit: Payer: Self-pay

## 2021-10-18 NOTE — Progress Notes (Signed)
Spoke w/ via phone for pre-op interview--- pt Lab needs dos----   urine preg (per anes)/  pre-op orders pending            Lab results------ current lab work done 10-04-2021 CBCdiff result in epic COVID test -----patient states asymptomatic no test needed Arrive at ------- 0845 on 10-19-2021 NPO after MN NO Solid Food.  Clear liquids from MN until--- 0745 Med rec completed Medications to take morning of surgery ----- wellbutrin Diabetic medication ----- n/a Patient instructed no nail polish to be worn day of surgery Patient instructed to bring photo id and insurance card day of surgery Patient aware to have Driver (ride ) / caregiver  for 24 hours after surgery ---friend, ronda Patient Special Instructions ----- n/a Pre-Op special Istructions -----sent inbox message to dr Oscar La in epic, requests orders Patient verbalized understanding of instructions that were given at this phone interview. Patient denies shortness of breath, chest pain, fever, cough at this phone interview.

## 2021-10-19 ENCOUNTER — Ambulatory Visit (HOSPITAL_BASED_OUTPATIENT_CLINIC_OR_DEPARTMENT_OTHER): Payer: BC Managed Care – PPO | Admitting: Anesthesiology

## 2021-10-19 ENCOUNTER — Ambulatory Visit (HOSPITAL_BASED_OUTPATIENT_CLINIC_OR_DEPARTMENT_OTHER)
Admission: RE | Admit: 2021-10-19 | Discharge: 2021-10-19 | Disposition: A | Discharge status: Home / Self Care | Payer: BC Managed Care – PPO | Attending: Obstetrics and Gynecology | Admitting: Obstetrics and Gynecology

## 2021-10-19 ENCOUNTER — Encounter (HOSPITAL_BASED_OUTPATIENT_CLINIC_OR_DEPARTMENT_OTHER)
Admission: RE | Disposition: A | Discharge status: Home / Self Care | Payer: Self-pay | Source: Home / Self Care | Attending: Obstetrics and Gynecology

## 2021-10-19 ENCOUNTER — Encounter (HOSPITAL_BASED_OUTPATIENT_CLINIC_OR_DEPARTMENT_OTHER): Payer: Self-pay | Admitting: Obstetrics and Gynecology

## 2021-10-19 ENCOUNTER — Other Ambulatory Visit: Payer: Self-pay

## 2021-10-19 DIAGNOSIS — R519 Headache, unspecified: Secondary | ICD-10-CM | POA: Insufficient documentation

## 2021-10-19 DIAGNOSIS — Z01818 Encounter for other preprocedural examination: Secondary | ICD-10-CM

## 2021-10-19 DIAGNOSIS — N879 Dysplasia of cervix uteri, unspecified: Secondary | ICD-10-CM | POA: Diagnosis present

## 2021-10-19 DIAGNOSIS — F32A Depression, unspecified: Secondary | ICD-10-CM | POA: Insufficient documentation

## 2021-10-19 DIAGNOSIS — N72 Inflammatory disease of cervix uteri: Secondary | ICD-10-CM | POA: Diagnosis not present

## 2021-10-19 DIAGNOSIS — R87613 High grade squamous intraepithelial lesion on cytologic smear of cervix (HGSIL): Secondary | ICD-10-CM | POA: Diagnosis not present

## 2021-10-19 HISTORY — DX: High grade squamous intraepithelial lesion on cytologic smear of cervix (HGSIL): R87.613

## 2021-10-19 HISTORY — DX: Other chronic pain: G89.29

## 2021-10-19 HISTORY — PX: LEEP: SHX91

## 2021-10-19 HISTORY — DX: Anemia, unspecified: D64.9

## 2021-10-19 HISTORY — DX: Low back pain, unspecified: M54.50

## 2021-10-19 HISTORY — DX: Spondylosis without myelopathy or radiculopathy, lumbar region: M47.816

## 2021-10-19 LAB — POCT PREGNANCY, URINE: Preg Test, Ur: NEGATIVE

## 2021-10-19 SURGERY — LEEP (LOOP ELECTROSURGICAL EXCISION PROCEDURE)
Anesthesia: General | Site: Vagina

## 2021-10-19 MED ORDER — AMISULPRIDE (ANTIEMETIC) 5 MG/2ML IV SOLN: 10.0000 mg | Freq: Once | INTRAVENOUS | Status: DC | PRN

## 2021-10-19 MED ORDER — MIDAZOLAM HCL 2 MG/2ML IJ SOLN
INTRAMUSCULAR | Status: AC
  Filled 2021-10-19: qty 2

## 2021-10-19 MED ORDER — ONDANSETRON HCL 4 MG/2ML IJ SOLN: 4.0000 mg | Freq: Once | INTRAMUSCULAR | Status: DC | PRN

## 2021-10-19 MED ORDER — FERRIC SUBSULFATE (BULK) SOLN
Status: DC | PRN
  Administered 2021-10-19: 1 via TOPICAL

## 2021-10-19 MED ORDER — IBUPROFEN 800 MG PO TABS: 800.0000 mg | ORAL_TABLET | Freq: Three times a day (TID) | ORAL | 0 refills | Status: DC | PRN

## 2021-10-19 MED ORDER — PROPOFOL 10 MG/ML IV BOLUS
INTRAVENOUS | Status: DC | PRN
  Administered 2021-10-19 (×4): 100 mg via INTRAVENOUS

## 2021-10-19 MED ORDER — MIDAZOLAM HCL 5 MG/5ML IJ SOLN
INTRAMUSCULAR | Status: DC | PRN
Start: 2021-10-19 — End: 2021-10-19
  Administered 2021-10-19: 2 mg via INTRAVENOUS

## 2021-10-19 MED ORDER — PROPOFOL 10 MG/ML IV BOLUS
INTRAVENOUS | Status: AC
  Filled 2021-10-19: qty 20

## 2021-10-19 MED ORDER — LIDOCAINE-EPINEPHRINE (PF) 1 %-1:200000 IJ SOLN
INTRAMUSCULAR | Status: DC | PRN
  Administered 2021-10-19: 10 mL

## 2021-10-19 MED ORDER — PROPOFOL 10 MG/ML IV BOLUS
INTRAVENOUS | Status: AC
Start: 2021-10-19 — End: ?
  Filled 2021-10-19: qty 20

## 2021-10-19 MED ORDER — ONDANSETRON HCL 4 MG/2ML IJ SOLN
INTRAMUSCULAR | Status: DC | PRN
  Administered 2021-10-19: 4 mg via INTRAVENOUS

## 2021-10-19 MED ORDER — LIDOCAINE HCL (PF) 2 % IJ SOLN
INTRAMUSCULAR | Status: AC
  Filled 2021-10-19: qty 5

## 2021-10-19 MED ORDER — KETOROLAC TROMETHAMINE 30 MG/ML IJ SOLN
INTRAMUSCULAR | Status: DC | PRN
  Administered 2021-10-19: 30 mg via INTRAVENOUS

## 2021-10-19 MED ORDER — LACTATED RINGERS IV SOLN: INTRAVENOUS | Status: DC

## 2021-10-19 MED ORDER — OXYCODONE HCL 5 MG/5ML PO SOLN: 5.0000 mg | Freq: Once | ORAL | Status: DC | PRN

## 2021-10-19 MED ORDER — ACETAMINOPHEN 500 MG PO TABS
ORAL_TABLET | ORAL | Status: AC
  Filled 2021-10-19: qty 2

## 2021-10-19 MED ORDER — DEXAMETHASONE SODIUM PHOSPHATE 10 MG/ML IJ SOLN
INTRAMUSCULAR | Status: AC
  Filled 2021-10-19: qty 1

## 2021-10-19 MED ORDER — FENTANYL CITRATE (PF) 100 MCG/2ML IJ SOLN
INTRAMUSCULAR | Status: DC | PRN
  Administered 2021-10-19 (×2): 50 ug via INTRAVENOUS

## 2021-10-19 MED ORDER — FENTANYL CITRATE (PF) 100 MCG/2ML IJ SOLN
INTRAMUSCULAR | Status: AC
  Filled 2021-10-19: qty 2

## 2021-10-19 MED ORDER — LIDOCAINE HCL (CARDIAC) PF 100 MG/5ML IV SOSY
PREFILLED_SYRINGE | INTRAVENOUS | Status: DC | PRN
  Administered 2021-10-19: 100 mg via INTRAVENOUS

## 2021-10-19 MED ORDER — ONDANSETRON HCL 4 MG/2ML IJ SOLN
INTRAMUSCULAR | Status: AC
  Filled 2021-10-19: qty 2

## 2021-10-19 MED ORDER — DEXAMETHASONE SODIUM PHOSPHATE 4 MG/ML IJ SOLN
INTRAMUSCULAR | Status: DC | PRN
  Administered 2021-10-19: 10 mg via INTRAVENOUS

## 2021-10-19 MED ORDER — OXYCODONE HCL 5 MG PO TABS: 5.0000 mg | ORAL_TABLET | Freq: Once | ORAL | Status: DC | PRN

## 2021-10-19 MED ORDER — ACETAMINOPHEN 500 MG PO TABS
1000.0000 mg | ORAL_TABLET | Freq: Once | ORAL | Status: AC
  Administered 2021-10-19: 1000 mg via ORAL

## 2021-10-19 MED ORDER — IODINE STRONG (LUGOLS) 5 % PO SOLN: ORAL | Status: DC | PRN

## 2021-10-19 MED ORDER — IODINE STRONG (LUGOLS) 5 % PO SOLN
ORAL | Status: DC | PRN
  Administered 2021-10-19: 14 mL

## 2021-10-19 MED ORDER — FENTANYL CITRATE (PF) 100 MCG/2ML IJ SOLN: 25.0000 ug | INTRAMUSCULAR | Status: DC | PRN

## 2021-10-19 MED ORDER — KETOROLAC TROMETHAMINE 30 MG/ML IJ SOLN
INTRAMUSCULAR | Status: AC
  Filled 2021-10-19: qty 1

## 2021-10-19 SURGICAL SUPPLY — 35 items
APL SWBSTK 6 STRL LF DISP (MISCELLANEOUS) ×1
APPLICATOR COTTON TIP 6 STRL (MISCELLANEOUS) ×1 IMPLANT
APPLICATOR COTTON TIP 6IN STRL (MISCELLANEOUS) ×2
DRSG TELFA 3X8 NADH (GAUZE/BANDAGES/DRESSINGS) ×2 IMPLANT
ELECT BALL LEEP 3MM BLK (ELECTRODE) IMPLANT
ELECT BALL LEEP 5MM RED (ELECTRODE) ×2 IMPLANT
ELECT LEEP 2.0X0.8 R2008 (MISCELLANEOUS) ×2
ELECT LOOP LEEP RND 10X10 YLW (CUTTING LOOP) ×2
ELECT LOOP LEEP RND 15X12 GRN (CUTTING LOOP)
ELECT LOOP LEEP RND 20X12 WHT (CUTTING LOOP)
ELECT LOOP LEEP SQR 10X10 ORG (CUTTING LOOP) ×2
ELECT REM PT RETURN 9FT ADLT (ELECTROSURGICAL) ×2
ELECTRODE LEEP 2.0X0.8 R2008 (MISCELLANEOUS) ×1 IMPLANT
ELECTRODE LOOP LP RND 10X10YLW (CUTTING LOOP) ×1 IMPLANT
ELECTRODE LOOP LP RND 15X12GRN (CUTTING LOOP) IMPLANT
ELECTRODE LOOP LP RND 20X12WHT (CUTTING LOOP) IMPLANT
ELECTRODE LOOP LP SQR 10X10ORG (CUTTING LOOP) IMPLANT
ELECTRODE REM PT RTRN 9FT ADLT (ELECTROSURGICAL) ×1 IMPLANT
EXTENDER ELECT LOOP LEEP 10CM (CUTTING LOOP) IMPLANT
GAUZE 4X4 16PLY ~~LOC~~+RFID DBL (SPONGE) ×2 IMPLANT
GLOVE BIOGEL PI IND STRL 7.0 (GLOVE) ×2 IMPLANT
GLOVE BIOGEL PI INDICATOR 7.0 (GLOVE) ×2
GLOVE ECLIPSE 6.5 STRL STRAW (GLOVE) ×2 IMPLANT
GOWN STRL REUS W/TWL LRG LVL3 (GOWN DISPOSABLE) ×4 IMPLANT
KIT TURNOVER CYSTO (KITS) ×2 IMPLANT
NS IRRIG 1000ML POUR BTL (IV SOLUTION) ×2 IMPLANT
PACK VAGINAL WOMENS (CUSTOM PROCEDURE TRAY) ×2 IMPLANT
PAD DRESSING TELFA 3X8 NADH (GAUZE/BANDAGES/DRESSINGS) IMPLANT
PAD OB MATERNITY 4.3X12.25 (PERSONAL CARE ITEMS) ×2 IMPLANT
PENCIL SMOKE EVACUATOR (MISCELLANEOUS) ×2 IMPLANT
SCOPETTES 8  STERILE (MISCELLANEOUS) ×6
SCOPETTES 8 STERILE (MISCELLANEOUS) ×2 IMPLANT
SUT SILK 2 0 SH (SUTURE) ×2 IMPLANT
TOWEL OR 17X26 10 PK STRL BLUE (TOWEL DISPOSABLE) ×4 IMPLANT
TUBE CONNECTING 12X1/4 (SUCTIONS) ×2 IMPLANT

## 2021-10-19 NOTE — Anesthesia Postprocedure Evaluation (Signed)
Anesthesia Post Note  Patient: Pamela [name]  Procedure(s) Performed: LOOP ELECTROSURGICAL EXCISION PROCEDURE WITH COLPOSCOPY (Vagina )     Patient location during evaluation: PACU Anesthesia Type: General Level of consciousness: awake and alert Pain management: pain level controlled Vital Signs Assessment: post-procedure vital signs reviewed and stable Respiratory status: spontaneous breathing, nonlabored ventilation and respiratory function stable Cardiovascular status: blood pressure returned to baseline and stable Postop Assessment: no apparent nausea or vomiting Anesthetic complications: no   No notable events documented.  Last Vitals:  Vitals:   10/19/21 1226 10/19/21 1242  BP: 110/69 (!) 118/55  Pulse: (!) 49 (!) 46  Resp: 13 14  Temp:  36.7 C  SpO2: 96% 100%    Last Pain:  Vitals:   10/19/21 1242  TempSrc: Oral  PainSc: 3                  Lucretia Kern

## 2021-10-19 NOTE — Anesthesia Procedure Notes (Signed)
Procedure Name: LMA Insertion Date/Time: 10/19/2021 10:18 AM Performed by: Jessica Priest, CRNA Pre-anesthesia Checklist: Patient identified, Emergency Drugs available, Suction available, Patient being monitored and Timeout performed Patient Re-evaluated:Patient Re-evaluated prior to induction Oxygen Delivery Method: Circle system utilized Preoxygenation: Pre-oxygenation with 100% oxygen Induction Type: IV induction Ventilation: Mask ventilation without difficulty LMA: LMA inserted LMA Size: 4.0 Number of attempts: 1 Airway Equipment and Method: Bite block Placement Confirmation: positive ETCO2, breath sounds checked- equal and bilateral and CO2 detector Tube secured with: Tape Dental Injury: Teeth and Oropharynx as per pre-operative assessment

## 2021-10-19 NOTE — Anesthesia Preprocedure Evaluation (Signed)
Anesthesia Evaluation  Patient identified by MRN, date of birth, ID band Patient awake    Reviewed: Allergy & Precautions, NPO status , Patient's Chart, lab work & pertinent test results  History of Anesthesia Complications Negative for: history of anesthetic complications  Airway Mallampati: I  TM Distance: >3 FB Neck ROM: Full    Dental  (+) Dental Advisory Given, Teeth Intact   Pulmonary neg pulmonary ROS,    Pulmonary exam normal        Cardiovascular negative cardio ROS Normal cardiovascular exam     Neuro/Psych  Headaches, Anxiety Depression    GI/Hepatic negative GI ROS, Neg liver ROS,   Endo/Other  negative endocrine ROS  Renal/GU negative Renal ROS  negative genitourinary   Musculoskeletal negative musculoskeletal ROS (+)   Abdominal   Peds  Hematology negative hematology ROS (+)   Anesthesia Other Findings   Reproductive/Obstetrics Cervical HSIL                           Anesthesia Physical Anesthesia Plan  ASA: 2  Anesthesia Plan: General   Post-op Pain Management: Tylenol PO (pre-op)* and Toradol IV (intra-op)*   Induction: Intravenous  PONV Risk Score and Plan: 3 and Ondansetron, Dexamethasone, Midazolam and Treatment may vary due to age or medical condition  Airway Management Planned: LMA  Additional Equipment: None  Intra-op Plan:   Post-operative Plan: Extubation in OR  Informed Consent: I have reviewed the patients History and Physical, chart, labs and discussed the procedure including the risks, benefits and alternatives for the proposed anesthesia with the patient or authorized representative who has indicated his/her understanding and acceptance.     Dental advisory given  Plan Discussed with:   Anesthesia Plan Comments:         Anesthesia Quick Evaluation

## 2021-10-19 NOTE — Op Note (Signed)
Preoperative Diagnosis: high grade cervical dysplasia  Postoperative Diagnosis: same  Procedure: colposcopy, loop cone cervical biopsy  Surgeon: Dr Gertie Exon  Assistants: None  Anesthesia: General via LMA  EBL: 5 cc  Fluids: 700 cc LR  Urine output: not recorded  Indications for surgery: The patient is a 56 yo female, who presented with HSIL pap, +HPV 18. Colposcopic biopsies returned with CIN 2-3. The patient was counseled as to the risks of the procedure, including: infection, bleeding, future pregnancy risks and cervical stenosis. A consent form was signed.   The risks of the surgery were reviewed with the patient and the consent form was signed prior to her surgery.  Findings: colposcopy unsatisfactory  Specimens: loop cone exocervix, loop cone endocervix, ECC  Procedure: The patient was taken to the operating room with an IV in place. She was placed in the dorsal lithotomy position and anesthesia was administered. She voided on the way to the OR. A coated speculum was placed in the vagina. Under colposcopic guidance, Lugols solution was placed on the cervix and a paracervical block was injected using 1% lidocaine with epinephrine. Under colposcopic guidance, the 2 x 0.8 cm loop was used to remove a portion of the ectocervix taking care to get the entire transformation zone.  A second 1 x 1 cm loop was used to remove a portion of the endocervix. The settings were 55 cut, 50 coag with a blend of 1.  An ECC was performed. The cautery ball was then used to cauterize the base of the biopsy site and monsels were placed. The patient tolerated the procedure well.    The sponge and instrument count were correct.

## 2021-10-19 NOTE — Interval H&P Note (Signed)
History and Physical Interval Note:  10/19/2021 11:00 AM  Pamela [name]  has presented today for surgery, with the diagnosis of HSIL.  The various methods of treatment have been discussed with the patient and family. After consideration of risks, benefits and other options for treatment, the patient has consented to  Procedure(s): LOOP ELECTROSURGICAL EXCISION PROCEDURE WITH COLPOSCOPY (N/A) as a surgical intervention.  The patient's history has been reviewed, patient examined, no change in status, stable for surgery.  I have reviewed the patient's chart and labs.  Questions were answered to the patient's satisfaction.     Romualdo Bolk

## 2021-10-19 NOTE — Transfer of Care (Signed)
Immediate Anesthesia Transfer of Care Note  Patient: Pamela [name]  Procedure(s) Performed: Procedure(s) (LRB): LOOP ELECTROSURGICAL EXCISION PROCEDURE WITH COLPOSCOPY (N/A)  Patient Location: PACU  Anesthesia Type: General  Level of Consciousness: awake, sedated, patient cooperative and responds to stimulation  Airway & Oxygen Therapy: Patient Spontanous Breathing and Patient connected to [place] 02 and soft FM  Post-op Assessment: Report given to PACU RN, Post -op Vital signs reviewed and stable and Patient moving all extremities  Post vital signs: Reviewed and stable  Complications: No apparent anesthesia complications

## 2021-10-19 NOTE — Discharge Instructions (Addendum)
No acetaminophen/Tylenol until after 3:00pm today if needed for pain.   No ibuprofen, Advil, Aleve, Motrin, ketorolac, meloxicam, naproxen, or other NSAIDS until after 5:41pm today if needed for pain.     Post Anesthesia Home Care Instructions  Activity: Get plenty of rest for the remainder of the day. A responsible individual must stay with you for 24 hours following the procedure.  For the next 24 hours, DO NOT: -Drive a car -Advertising copywriter -Drink alcoholic beverages -Take any medication unless instructed by your physician -Make any legal decisions or sign important papers.  Meals: Start with liquid foods such as gelatin or soup. Progress to regular foods as tolerated. Avoid greasy, spicy, heavy foods. If nausea and/or vomiting occur, drink only clear liquids until the nausea and/or vomiting subsides. Call your physician if vomiting continues.  Special Instructions/Symptoms: Your throat may feel dry or sore from the anesthesia or the breathing tube placed in your throat during surgery. If this causes discomfort, gargle with warm salt water. The discomfort should disappear within 24 hours.

## 2021-10-20 ENCOUNTER — Encounter (HOSPITAL_BASED_OUTPATIENT_CLINIC_OR_DEPARTMENT_OTHER): Payer: Self-pay | Admitting: Obstetrics and Gynecology

## 2021-10-21 LAB — SURGICAL PATHOLOGY

## 2021-10-22 ENCOUNTER — Encounter: Payer: Self-pay | Admitting: Obstetrics and Gynecology

## 2021-10-22 DIAGNOSIS — R300 Dysuria: Secondary | ICD-10-CM

## 2021-10-22 MED ORDER — CIPROFLOXACIN HCL 500 MG PO TABS: 500.0000 mg | ORAL_TABLET | Freq: Two times a day (BID) | ORAL | 0 refills | Status: DC

## 2021-10-22 NOTE — Telephone Encounter (Signed)
Per ML: "If patient confirms no allergy to Cipro, agree with Cipro 500 mg BID x 7 days"

## 2021-10-26 ENCOUNTER — Encounter: Payer: Self-pay | Admitting: Obstetrics and Gynecology

## 2021-10-27 MED ORDER — MEDROXYPROGESTERONE ACETATE 5 MG PO TABS: ORAL_TABLET | ORAL | 1 refills | Status: DC

## 2021-11-15 ENCOUNTER — Encounter: Payer: Self-pay | Admitting: Obstetrics and Gynecology

## 2021-11-15 ENCOUNTER — Ambulatory Visit (INDEPENDENT_AMBULATORY_CARE_PROVIDER_SITE_OTHER): Payer: BC Managed Care – PPO | Admitting: Obstetrics and Gynecology

## 2021-11-15 VITALS — BP 122/64 | HR 77 | Ht 69.0 in | Wt 142.0 lb

## 2021-11-15 DIAGNOSIS — Z9889 Other specified postprocedural states: Secondary | ICD-10-CM | POA: Diagnosis not present

## 2021-11-15 DIAGNOSIS — N871 Moderate cervical dysplasia: Secondary | ICD-10-CM | POA: Diagnosis not present

## 2021-11-15 NOTE — Progress Notes (Signed)
GYNECOLOGY  VISIT   HPI: 56 y.o.   Single Other or two or more races Not Hispanic or Latino  female   [id number] with Patient's last menstrual period was 08/11/2021 (approximate).   here for follow up from a LEEP.  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  Pathology was benign without dysplasia.   GYNECOLOGIC HISTORY: Patient's last menstrual period was 08/11/2021 (approximate). Contraception:not sexually active  Menopausal hormone therapy: none        OB History     Gravida  0   Para  0   Term  0   Preterm  0   AB  0   Living  0      SAB  0   IAB  0   Ectopic  0   Multiple  0   Live Births  0              Patient Active Problem List   Diagnosis Date Noted   Abnormal cervical Papanicolaou smear 09/07/2021   Fracture of olecranon process of ulna 07/09/2019   Migraine with aura and without status migrainosus, not intractable 03/01/2014   Adjustment disorder with depressed mood 03/01/2014   Dysmenorrhea 03/01/2014   Exposure to potentially hazardous body fluids 03/01/2014   Joint pain, knee 03/01/2014   Menopausal and perimenopausal disorder 03/01/2014   Situational anxiety 03/01/2014   Hair loss 02/24/2014   Numbness and tingling of leg 02/24/2014   Facet syndrome, lumbar 02/24/2014   Depression 11/05/2013   Low back pain 11/05/2013   Insomnia 11/05/2013   Sacroiliac joint dysfunction 10/15/2013    Past Medical History:  Diagnosis Date   Adjustment disorder with depressed mood    Anemia    Chronic right-sided low back pain without sciatica    Depression    Facet syndrome, lumbar    HSIL (high grade squamous intraepithelial lesion) on Pap smear of cervix    Migraine without aura and without status migrainosus, not intractable    Vitamin D deficiency     Past Surgical History:  Procedure Laterality Date   COLONOSCOPY WITH PROPOFOL  09/25/2017   by dr Lavon Paganini   HARDWARE REMOVAL Left 2021   @[facility] ;   left elbow   LEEP N/A 10/19/2021    Procedure: LOOP ELECTROSURGICAL EXCISION PROCEDURE WITH COLPOSCOPY;  Surgeon: 10/21/2021, MD;  Location: [facility];  Service: Gynecology;  Laterality: N/A;   ORIF ELBOW FRACTURE Left 07/10/2019   Procedure: OPEN REDUCTION INTERNAL FIXATION (ORIF) LEFT ELBOW/OLECRANON FRACTURE;  Surgeon: 09/07/2019, MD;  Location: [facility];  Service: Orthopedics;  Laterality: Left;    Current Outpatient Medications  Medication Sig Dispense Refill   Aspirin-Acetaminophen-Caffeine (EXCEDRIN MIGRAINE PO) Take 2 tablets by mouth daily as needed.     Aspirin-Salicylamide-Caffeine (BC HEADACHE POWDER PO) Take 1 Dose by mouth as needed.     buPROPion (WELLBUTRIN XL) 300 MG 24 hr tablet Take 150 mg by mouth daily.      ciprofloxacin (CIPRO) 500 MG tablet Take 1 tablet (500 mg total) by mouth 2 (two) times daily. 14 tablet 0   ibuprofen (ADVIL) 800 MG tablet Take 1 tablet (800 mg total) by mouth every 8 (eight) hours as needed. 30 tablet 0   medroxyPROGESTERone (PROVERA) 5 MG tablet Take one tablet a day for 5 days now and every other month if no spontaneous cycle. If you go 6 months without any bleeding, you can stop taking it.  15 tablet 1   Multiple Vitamin (MULTIVITAMIN ADULT PO) Take by mouth daily.     sertraline (ZOLOFT) 100 MG tablet Take 1 tablet by mouth at bedtime.     No current facility-administered medications for this visit.     ALLERGIES: Doxycycline monohydrate, Erythromycin, Other, and Sulfa antibiotics  Family History  Problem Relation Age of Onset   Migraines Mother    Heart disease Paternal Grandfather    Colon cancer Neg Hx    Esophageal cancer Neg Hx    Stomach cancer Neg Hx     Social History   Socioeconomic History   Marital status: Single    Spouse name: Not on file   Number of children: Not on file   Years of education: Not on file   Highest education level: Not on file  Occupational History   Not on file  Tobacco Use   Smoking  status: Never   Smokeless tobacco: Never  Vaping Use   Vaping Use: Never used  Substance and Sexual Activity   Alcohol use: No    Alcohol/week: 0.0 standard drinks of alcohol   Drug use: Never   Sexual activity: Not on file  Other Topics Concern   Not on file  Social History Narrative      She works on [employer].   Highest level of education:  Does not want to answer.      Social Documentation updated per patient request. 10/07/21   Social Determinants of Health   Financial Resource Strain: Not on file  Food Insecurity: Not on file  Transportation Needs: Not on file  Physical Activity: Not on file  Stress: Not on file  Social Connections: Not on file  Intimate Partner Violence: Not on file    ROS  PHYSICAL EXAMINATION:    LMP 08/11/2021 (Approximate) Comment: DOS UPREG NEGATIVE    General appearance: alert, cooperative and appears stated age  Pelvic: External genitalia:  no lesions              Urethra:  normal appearing urethra with no masses, tenderness or lesions              Bartholins and Skenes: normal                 Vagina: normal appearing vagina with normal color and discharge, no lesions              Cervix: no lesions and still healing, erythematous, no bleeding, no signs of infection, no CMT              Bimanual Exam:  Uterus:  normal size, contour, position, consistency, mobility, non-tender              Adnexa: no mass, fullness, tenderness               Chaperone was present for exam.  1. H/O LEEP Doing well F/U pap in 5 months

## 2022-04-01 LAB — HM PAP SMEAR: HM Pap smear: NORMAL

## 2022-04-06 IMAGING — CT CT 3D INDEPENDENT WKST
3 of 6 series · 12 of 36 positions shown, 14 images · non-contrast
Comparison: Left elbow x-rays dated July 10, 2019. CT left
elbow dated July 09, 2019.

CLINICAL DATA: Chronic left elbow pain. Prior proximal ulna ORIF in
[REDACTED].

EXAM:
CT OF THE UPPER LEFT EXTREMITY WITHOUT CONTRAST
TECHNIQUE: Multidetector CT imaging of the left elbow was performed according
to the standard protocol. 3-dimensional CT images were rendered by
post-processing of the original CT data on an acquisition
workstation. The 3-dimensional CT images were interpreted and
findings were reported in the accompanying complete CT report for
this study.

[Series 3: elbow 1.50 br60 s3 axial bone hd fov · axial · 0.21mm/px · z∈[-915,-788]mm · 5 of 241 slices shown, 7 images]
[im 41/241  soft-tissue]
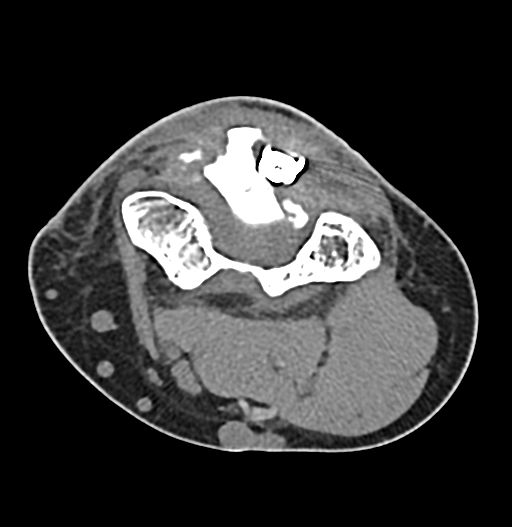
[im 41/241  bone]
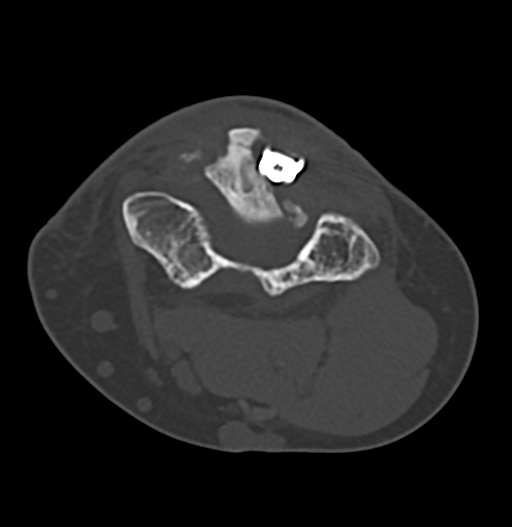
[im 81/241  bone]
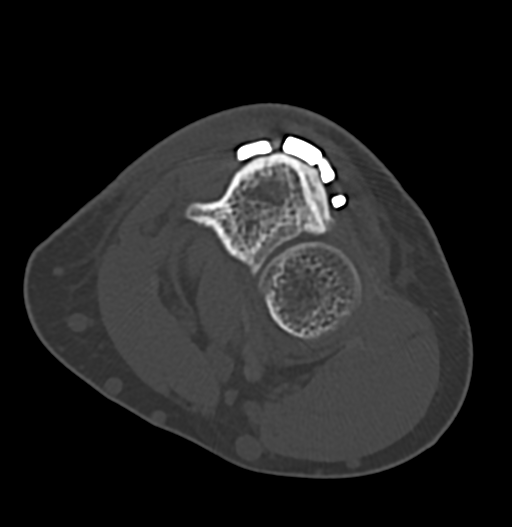
[im 121/241  bone]
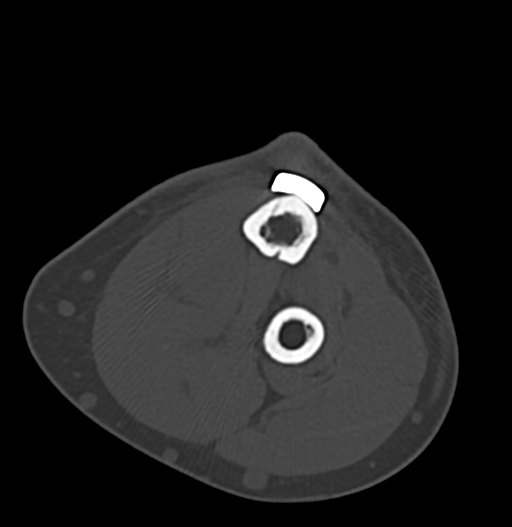
[im 161/241  bone]
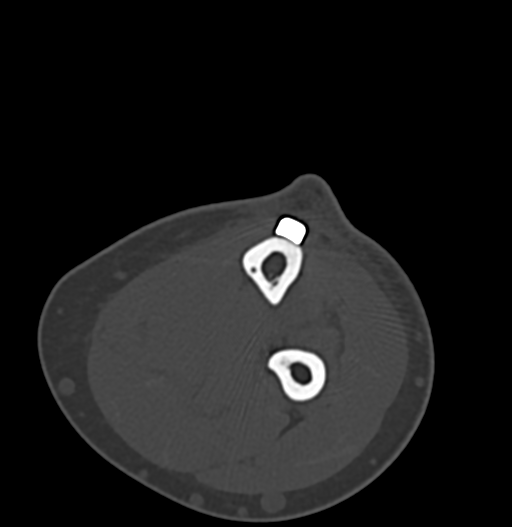
[im 201/241  soft-tissue]
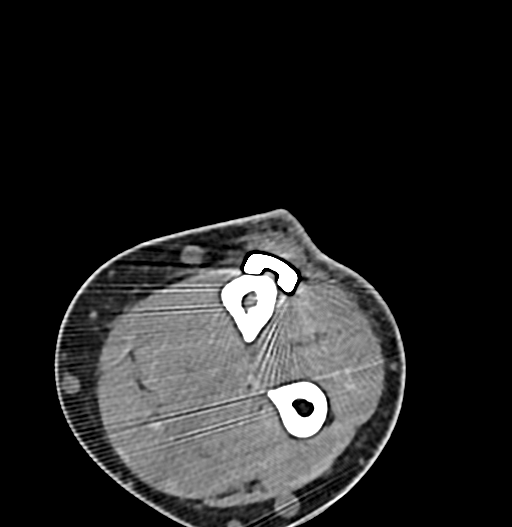
[im 201/241  bone]
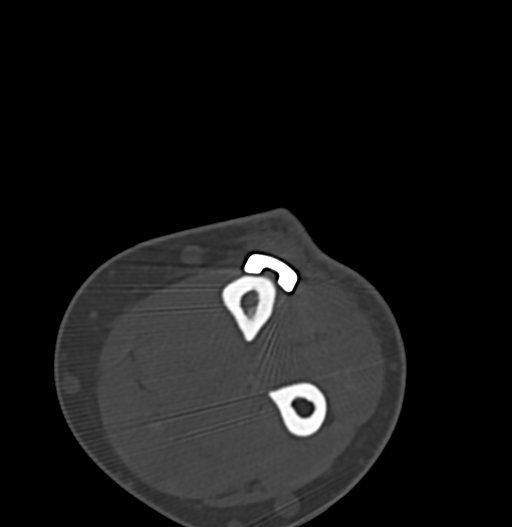

[Series 9: elbow 1.50 hr60 s3 cor bone hd fov · coronal · 0.24mm/px · 1 of 127 slices shown]
[im 64/127  bone]
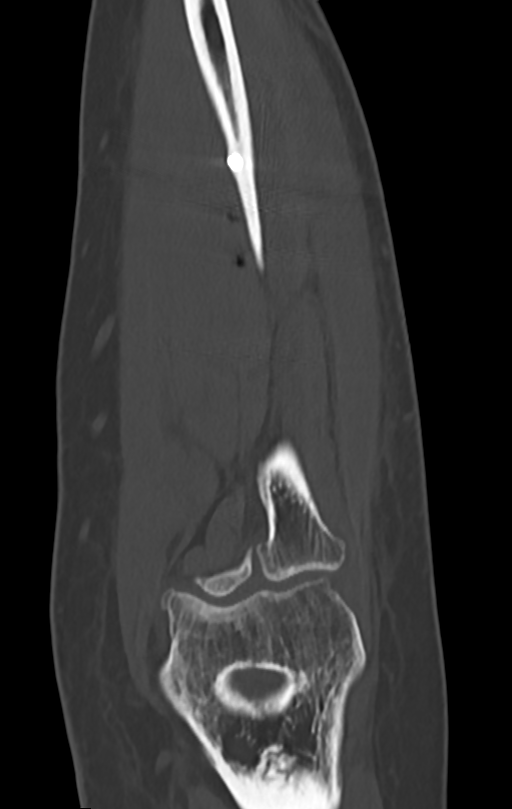

[Series 13: elbow 1.50 br60 s3 sag bone hd fov · sagittal · 0.24mm/px · 6 of 133 slices shown]
[im 6/133  soft-tissue]
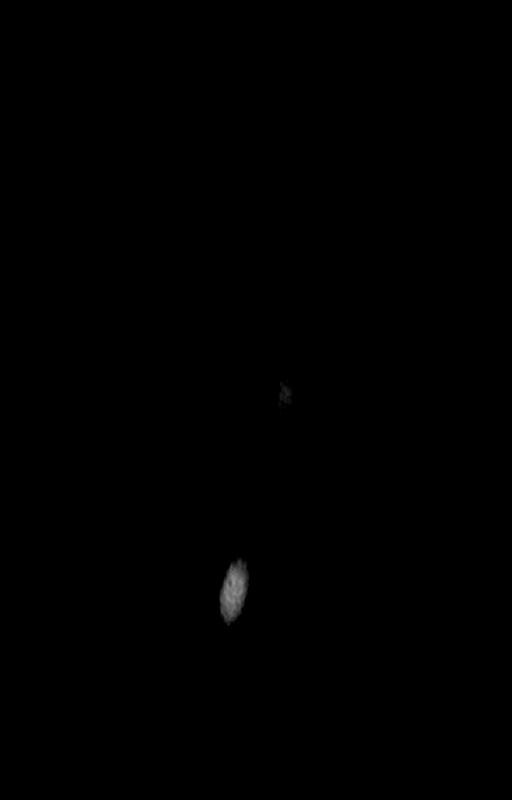
[im 23/133  bone]
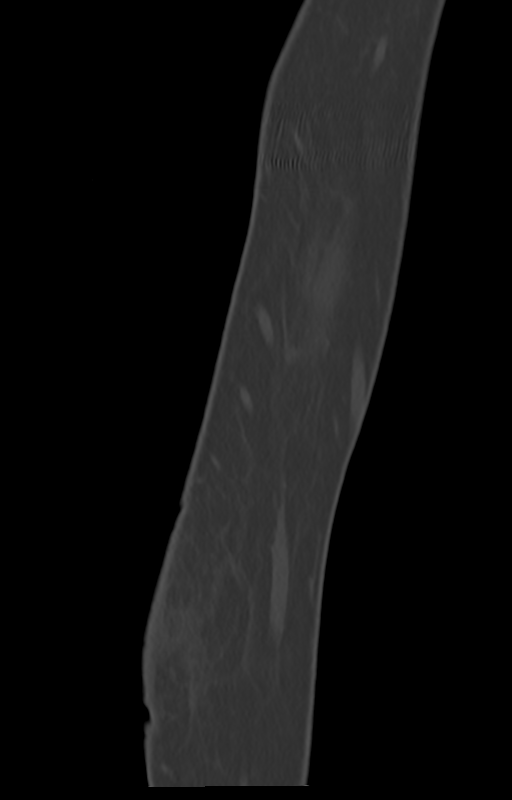
[im 45/133  bone]
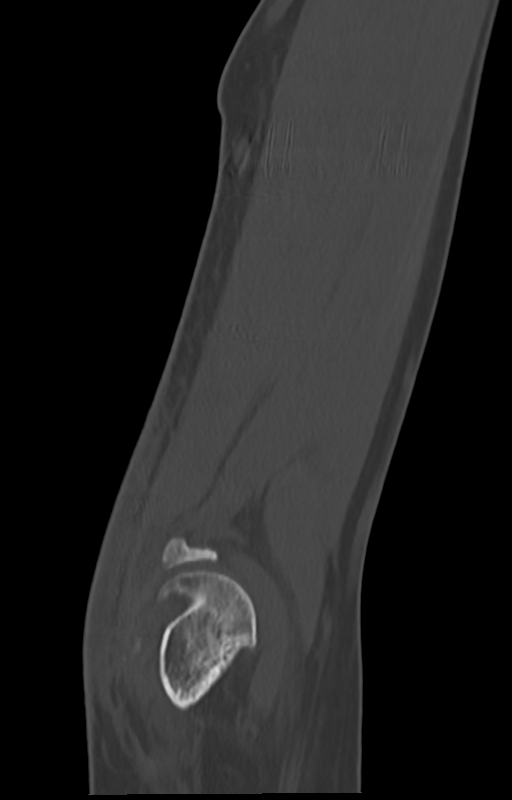
[im 67/133  bone]
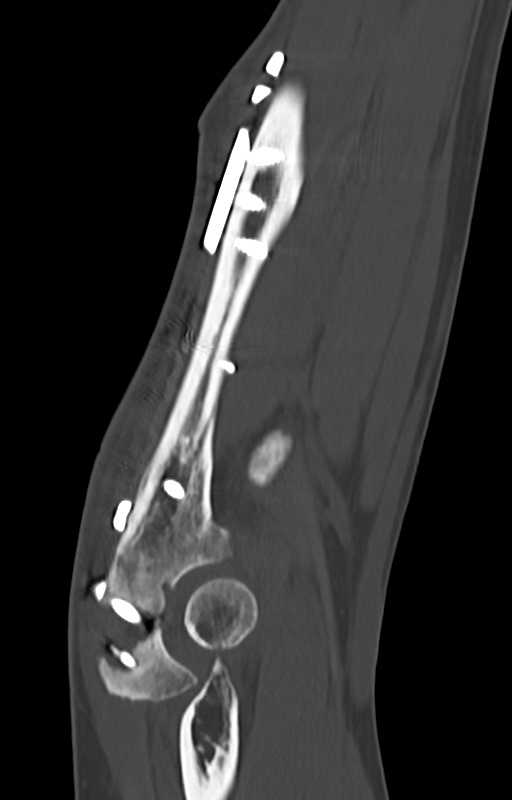
[im 89/133  bone]
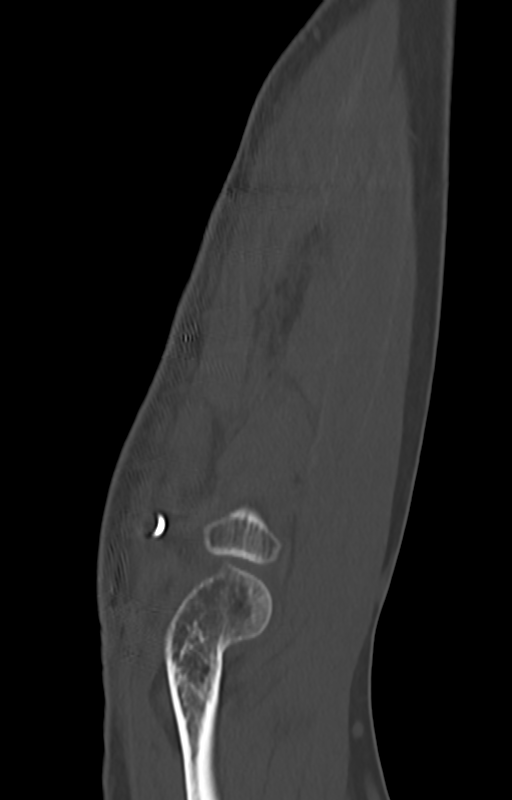
[im 111/133  bone]
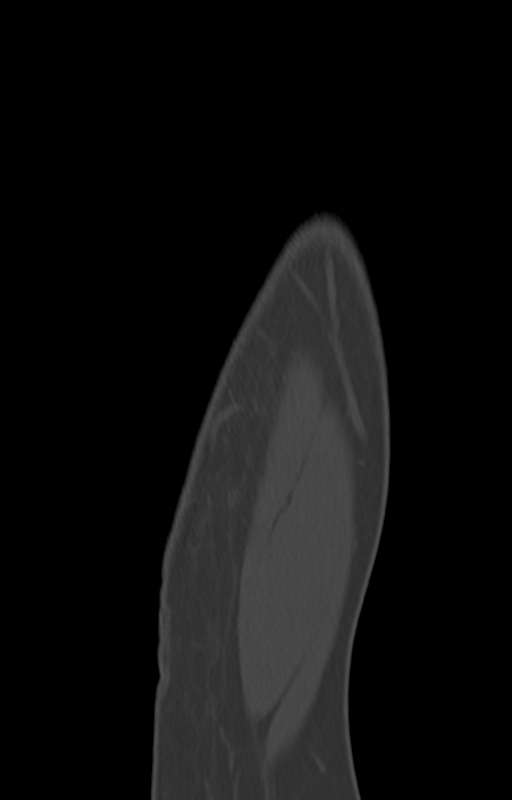

[12 of 36 positions shown; findings below may reference images not displayed]

FINDINGS: Bones/Joint/Cartilage

Comminuted fracture of the proximal ulna again noted status post
plate and screw fixation. The proximal most midline and ulnar screws
have loosened with new 1.4 cm proximal displacement of the olecranon
process when compared to postoperative x-rays from [facility]. Smaller
fragments more ulnarly remain unincorporated. The proximal
diaphyseal component has healed. No dislocation. Small joint
effusion.

Ligaments

Ligaments are suboptimally evaluated by CT.

Muscles and Tendons
Grossly intact.  No muscle atrophy.

Soft tissue
Mild posterior elbow soft tissue swelling. No fluid collection or
hematoma. No soft tissue mass.
IMPRESSION: 1. Chronic comminuted fracture of the proximal ulna status post
ORIF. Loosening of proximal most midline and ulnar screws with new
1.4 cm proximal displacement of the olecranon process when compared
to postoperative x-rays from [facility].

## 2022-04-06 IMAGING — CT CT ELBOW*L* W/O CM
3 of 6 series · 12 of 36 positions shown, 14 images · non-contrast
Comparison: Left elbow x-rays dated July 10, 2019. CT left
elbow dated July 09, 2019.

CLINICAL DATA: Chronic left elbow pain. Prior proximal ulna ORIF in
[REDACTED].

EXAM:
CT OF THE UPPER LEFT EXTREMITY WITHOUT CONTRAST
TECHNIQUE: Multidetector CT imaging of the left elbow was performed according
to the standard protocol. 3-dimensional CT images were rendered by
post-processing of the original CT data on an acquisition
workstation. The 3-dimensional CT images were interpreted and
findings were reported in the accompanying complete CT report for
this study.

[Series 3: elbow 1.50 br60 s3 axial bone hd fov · axial · 0.21mm/px · z∈[-915,-788]mm · 5 of 241 slices shown, 7 images]
[im 41/241  soft-tissue]
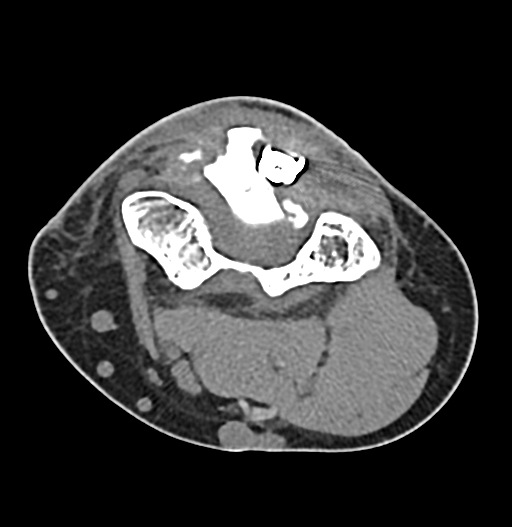
[im 41/241  bone]
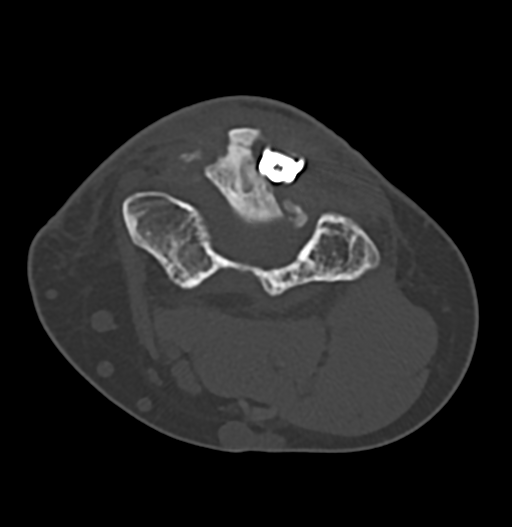
[im 81/241  bone]
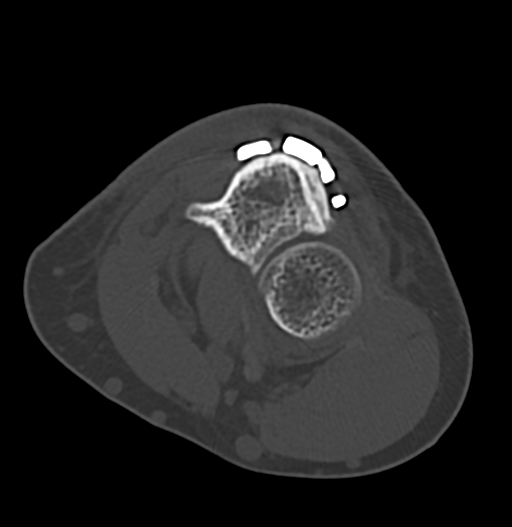
[im 121/241  bone]
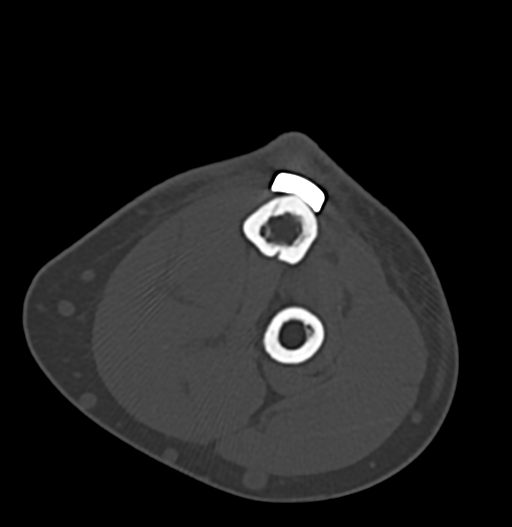
[im 161/241  bone]
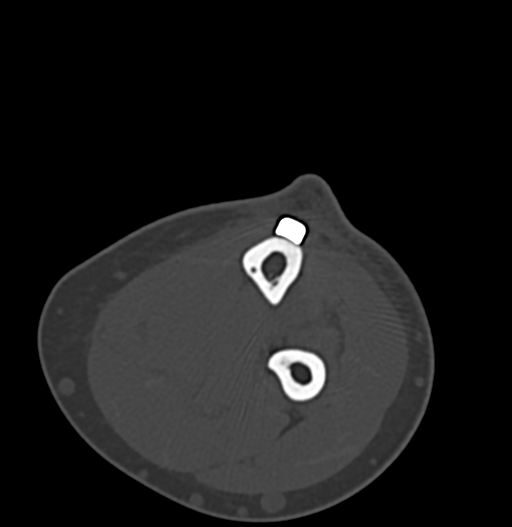
[im 201/241  soft-tissue]
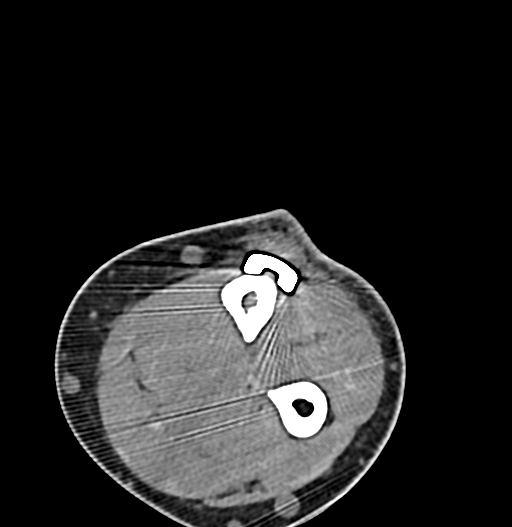
[im 201/241  bone]
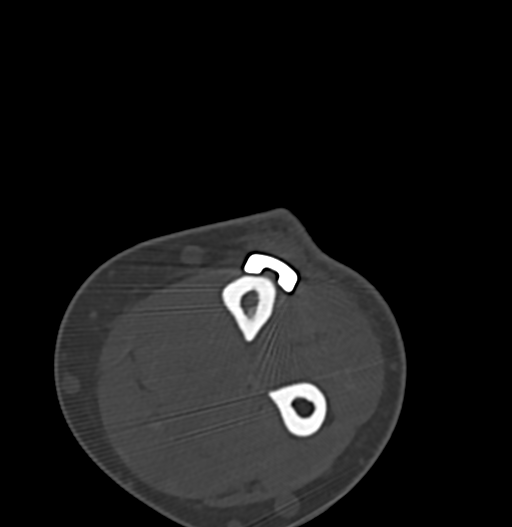

[Series 9: elbow 1.50 hr60 s3 cor bone hd fov · coronal · 0.24mm/px · 1 of 127 slices shown]
[im 64/127  bone]
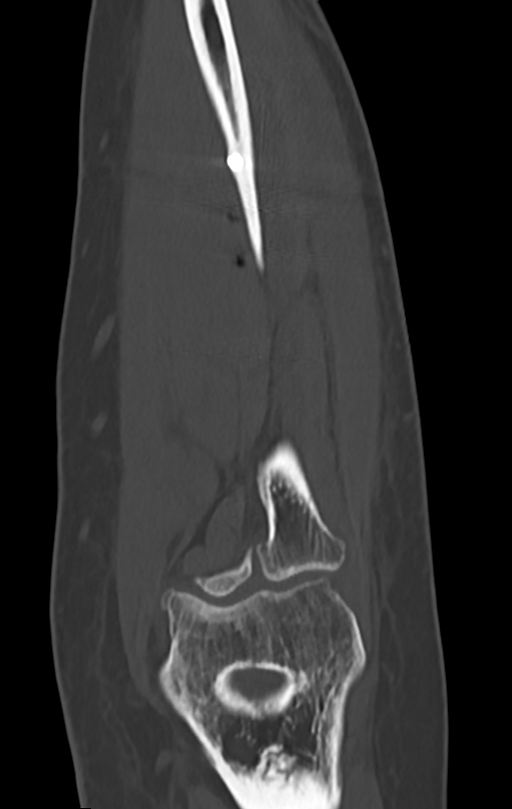

[Series 13: elbow 1.50 br60 s3 sag bone hd fov · sagittal · 0.24mm/px · 6 of 133 slices shown]
[im 6/133  soft-tissue]
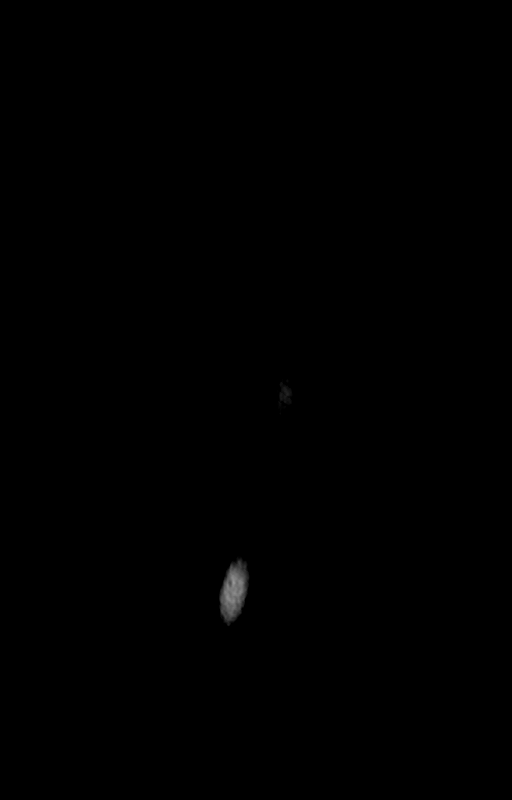
[im 23/133  bone]
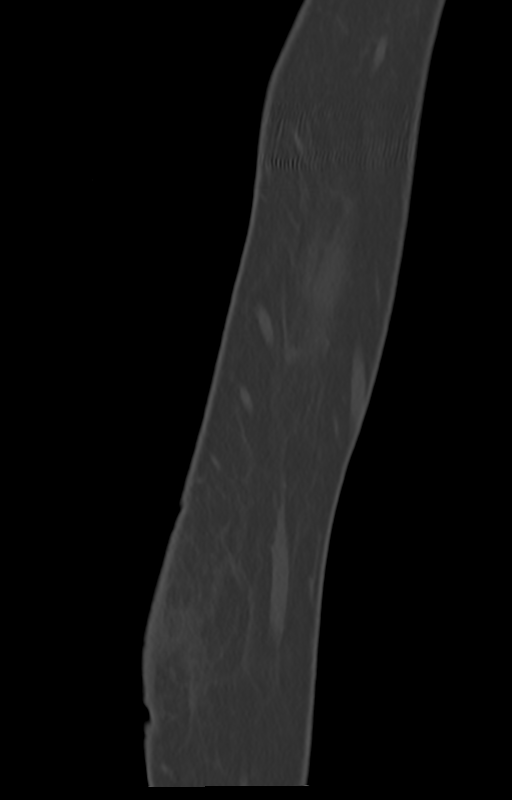
[im 45/133  bone]
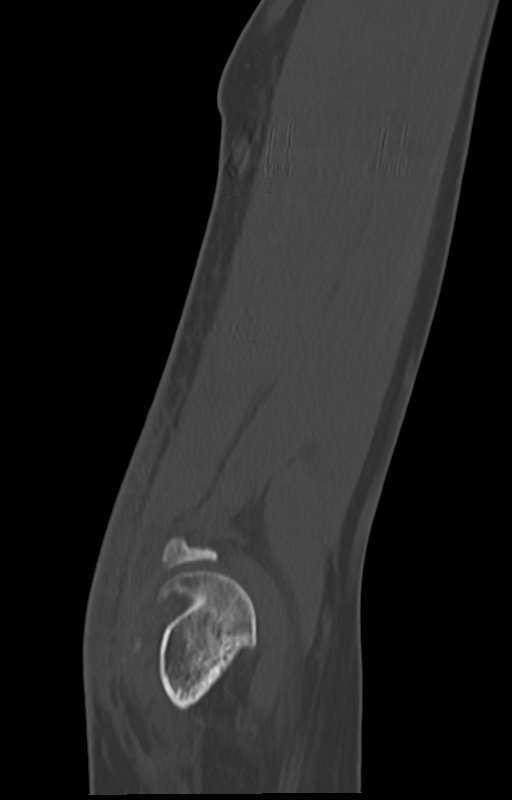
[im 67/133  bone]
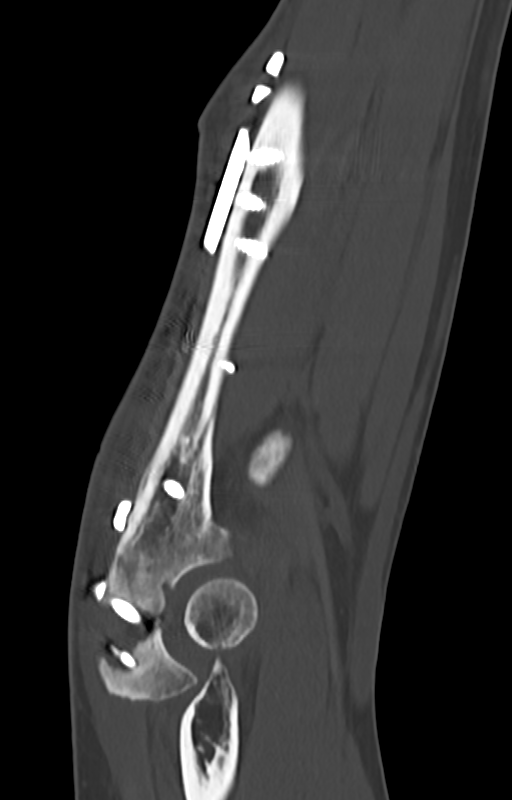
[im 89/133  bone]
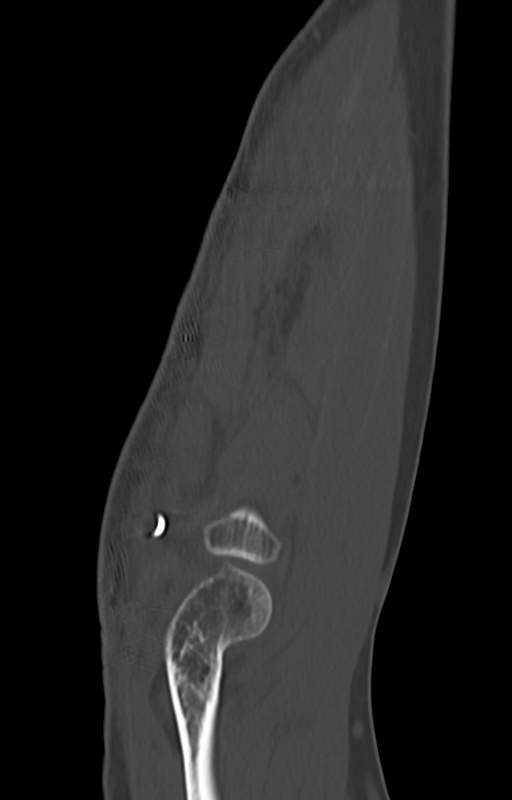
[im 111/133  bone]
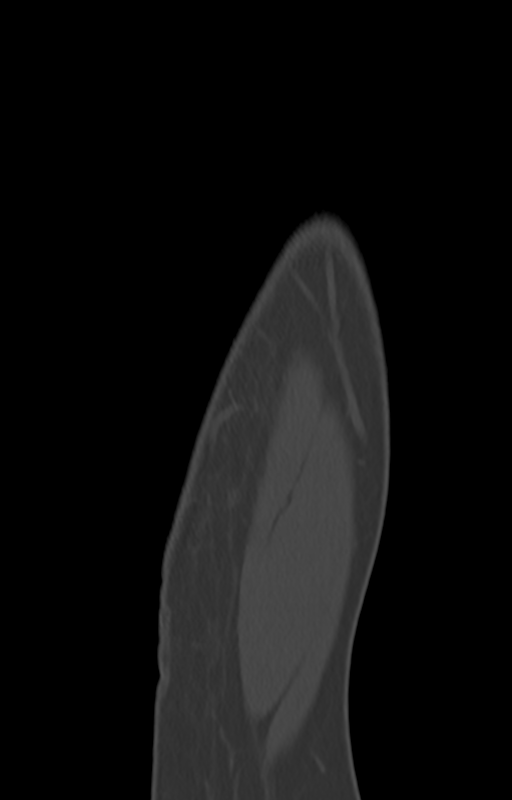

[12 of 36 positions shown; findings below may reference images not displayed]

FINDINGS: Bones/Joint/Cartilage

Comminuted fracture of the proximal ulna again noted status post
plate and screw fixation. The proximal most midline and ulnar screws
have loosened with new 1.4 cm proximal displacement of the olecranon
process when compared to postoperative x-rays from [facility]. Smaller
fragments more ulnarly remain unincorporated. The proximal
diaphyseal component has healed. No dislocation. Small joint
effusion.

Ligaments

Ligaments are suboptimally evaluated by CT.

Muscles and Tendons
Grossly intact.  No muscle atrophy.

Soft tissue
Mild posterior elbow soft tissue swelling. No fluid collection or
hematoma. No soft tissue mass.
IMPRESSION: 1. Chronic comminuted fracture of the proximal ulna status post
ORIF. Loosening of proximal most midline and ulnar screws with new
1.4 cm proximal displacement of the olecranon process when compared
to postoperative x-rays from [facility].

## 2022-05-26 DIAGNOSIS — I872 Venous insufficiency (chronic) (peripheral): Secondary | ICD-10-CM | POA: Insufficient documentation

## 2022-05-26 DIAGNOSIS — I83813 Varicose veins of bilateral lower extremities with pain: Secondary | ICD-10-CM | POA: Insufficient documentation

## 2022-09-15 ENCOUNTER — Other Ambulatory Visit: Payer: Self-pay | Admitting: Family Medicine

## 2022-09-15 DIAGNOSIS — G43519 Persistent migraine aura without cerebral infarction, intractable, without status migrainosus: Secondary | ICD-10-CM

## 2022-09-15 DIAGNOSIS — G43109 Migraine with aura, not intractable, without status migrainosus: Secondary | ICD-10-CM

## 2022-10-07 ENCOUNTER — Encounter: Payer: Self-pay | Admitting: Family Medicine

## 2022-10-10 ENCOUNTER — Other Ambulatory Visit: Payer: Self-pay | Admitting: Family Medicine

## 2022-10-10 ENCOUNTER — Ambulatory Visit
Admission: RE | Admit: 2022-10-10 | Discharge: 2022-10-10 | Disposition: A | Discharge status: Home / Self Care | Payer: BC Managed Care – PPO | Source: Ambulatory Visit | Attending: Family Medicine | Admitting: Family Medicine

## 2022-10-10 DIAGNOSIS — G43109 Migraine with aura, not intractable, without status migrainosus: Secondary | ICD-10-CM

## 2022-10-10 DIAGNOSIS — G43519 Persistent migraine aura without cerebral infarction, intractable, without status migrainosus: Secondary | ICD-10-CM

## 2023-05-31 DIAGNOSIS — S82091A Other fracture of right patella, initial encounter for closed fracture: Secondary | ICD-10-CM

## 2023-05-31 HISTORY — DX: Other fracture of right patella, initial encounter for closed fracture: S82.091A

## 2023-07-21 ENCOUNTER — Encounter: Payer: Self-pay | Admitting: Family Medicine

## 2023-07-21 ENCOUNTER — Ambulatory Visit: Payer: Self-pay | Admitting: Family Medicine

## 2023-07-21 VITALS — BP 120/80 | HR 65 | Temp 98.0°F | Ht 69.0 in | Wt 146.2 lb

## 2023-07-21 DIAGNOSIS — G4709 Other insomnia: Secondary | ICD-10-CM

## 2023-07-21 DIAGNOSIS — M533 Sacrococcygeal disorders, not elsewhere classified: Secondary | ICD-10-CM | POA: Diagnosis not present

## 2023-07-21 DIAGNOSIS — R87610 Atypical squamous cells of undetermined significance on cytologic smear of cervix (ASC-US): Secondary | ICD-10-CM

## 2023-07-21 DIAGNOSIS — G43109 Migraine with aura, not intractable, without status migrainosus: Secondary | ICD-10-CM

## 2023-07-21 DIAGNOSIS — Z1231 Encounter for screening mammogram for malignant neoplasm of breast: Secondary | ICD-10-CM | POA: Diagnosis not present

## 2023-07-21 DIAGNOSIS — F320 Major depressive disorder, single episode, mild: Secondary | ICD-10-CM

## 2023-07-21 DIAGNOSIS — F418 Other specified anxiety disorders: Secondary | ICD-10-CM

## 2023-07-21 MED ORDER — UBRELVY 50 MG PO TABS
ORAL_TABLET | ORAL | 0 refills | Status: DC
Start: 2023-07-21 — End: 2023-10-09

## 2023-07-21 MED ORDER — CLONAZEPAM 0.5 MG PO TABS
0.5000 mg | ORAL_TABLET | ORAL | 1 refills | Status: AC | PRN
Start: 2023-07-21 — End: ?

## 2023-07-21 NOTE — Assessment & Plan Note (Addendum)
 S/P LEEP 09/2021

## 2023-07-21 NOTE — Progress Notes (Signed)
 Patient Office Visit  Assessment & Plan:  Other insomnia  Visit for screening mammogram -     3D Screening Mammogram, Left and Right; Future  Migraine with aura and without status migrainosus, not intractable -     Bernita Raisin; Samples given- take one and repeat in 2 hrs if nec.  Dispense: 2 tablet; Refill: 0  Sacroiliac joint dysfunction  Current mild episode of major depressive disorder, unspecified whether recurrent (HCC)  Situational anxiety -     clonazePAM; Take 1 tablet (0.5 mg total) by mouth as needed for anxiety.  Dispense: 30 tablet; Refill: 1  Atypical squamous cells of undetermined significance on cytologic smear of cervix (ASC-US) Assessment & Plan: S/P LEEP 09/2021    Test results were reviewed and analyzed as part of the medical decision making of this visit.  Reviewed previous notes from [facility], OB/GYN consult notes from [facility]. Sample of Ubrelvy given to the patient- 50 mg tablets #2.  3D mammogram ordered at the breast center in [place].  Klonopin 0.5 mg as needed for anxiety/stress.  Patient has enough refills of the other medications.  FMLA paperwork completed today for both migraines and flareups of lower back/SI joint.  Return in the next 3 to 4 months for Pap smear/physical.  Patient declines flu shot today. If patient has any menstrual bleeding she will notify us re this- she is aware that this would not be normal.  Return in about 4 months (around 11/18/2023), or if symptoms worsen or fail to improve, for physical.   Subjective:    Patient ID: Pamela [name], female    DOB: 11/05/1965  Age: 58 y.o. MRN: [id number]  Chief Complaint  Patient presents with   Medical Management of Chronic Issues   Establish Care    HPI Migraines-has had more migraines recently.  Had about 2-3 the past 2 weeks.  Patient does think it stress related because she was doing well previously.  Patient has been using Tylenol PM at nighttime which does help.  Patient  has not taken triptans in the past and has not tried new medications such as Bernita Raisin. Pt is willing to try this. Pt does not have a headache today.  Pt has FMLA paperwork completed for migraines (separate from back pain) Depression - She takes Wellbutrin 300 mg daily without difficulty, pt needs to take DAW formulation of Wellbutrin XL. Pt has enough refills right now.  Anxiety -Pt has been under more stress. Her boss from [employer] got promoted and now she is handling his extra job duties since he left. Pt feeling overwhelmed and very stressed since he did not teach her his job duties. She has had to figure this out on her own. She has been taking  clonazepam 0.5 mg as needed more frequently the past few weeks. Pt does not take Ambien at the same time/.  Insomnia -pt has been struggling with insomnia since having to do her previous boss' job duties in addition to her regular job duties. Pt does take Ambien but not every night. Pt does not have negative cognitive deficits since taking Ambien or AM grogginess.   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 Pathology was benign without dysplasia.  subsequent Pap smear done by me November 2023 revealed Ascus, negative HR HPV.   Patient's OB/GYN retired and she does not have another OB/GYN.  Patient will need to schedule another Pap in the future.  Pt not taking Provera anymore.  Low Back pain/SI joint pain- pt has flare ups on and off and needs FMLA paperwork completed today. Pt has been doing Ok with her back lately.  Health maintenance-patient did have a colonoscopy, got the Shingrix vaccine, got the COVID vaccines.  Patient does not typically do the flu shot and is not up-to-date with the tetanus shot.  Patient knows that she needs to do a mammogram and we will order this. The 10-year ASCVD risk score (Arnett DK, et al., 2019) is: 1.4%  Past Medical History:  Diagnosis Date   Adjustment disorder with  depressed mood    Anemia    Chronic right-sided low back pain without sciatica    Depression    Facet syndrome, lumbar    HSIL (high grade squamous intraepithelial lesion) on Pap smear of cervix    Migraine without aura and without status migrainosus, not intractable    Vitamin D deficiency    Past Surgical History:  Procedure Laterality Date   COLONOSCOPY WITH PROPOFOL  09/25/2017   by dr Lavon Paganini   HARDWARE REMOVAL Left 2021   @[facility] ;   left elbow   LEEP N/A 10/19/2021   Procedure: LOOP ELECTROSURGICAL EXCISION PROCEDURE WITH COLPOSCOPY;  Surgeon: Romualdo Bolk, MD;  Location: [facility];  Service: Gynecology;  Laterality: N/A;   ORIF ELBOW FRACTURE Left 07/10/2019   Procedure: OPEN REDUCTION INTERNAL FIXATION (ORIF) LEFT ELBOW/OLECRANON FRACTURE;  Surgeon: Bjorn Pippin, MD;  Location: [facility];  Service: Orthopedics;  Laterality: Left;   Social History   Tobacco Use   Smoking status: Never   Smokeless tobacco: Never  Vaping Use   Vaping status: Never Used  Substance Use Topics   Alcohol use: No    Alcohol/week: 0.0 standard drinks of alcohol   Drug use: Never   Family History  Problem Relation Age of Onset   Migraines Mother    Fibroids Mother    Heart disease Paternal Grandfather    Colon cancer Neg Hx    Esophageal cancer Neg Hx    Stomach cancer Neg Hx    Cervical cancer Neg Hx    Ovarian cancer Neg Hx    Breast cancer Neg Hx    Allergies  Allergen Reactions   Doxycycline Monohydrate Hives   Erythromycin Hives   Other     Cycline-250=hives   Sulfa Antibiotics Hives    ROS    Objective:    BP 120/80   Pulse 65   Temp 98 F (36.7 C)   Ht 5\' 9"  (1.753 m)   Wt 146 lb 4 oz (66.3 kg)   LMP 07/01/2023 (Approximate)   SpO2 98%   BMI 21.60 kg/m  BP Readings from Last 3 Encounters:  07/21/23 120/80  11/15/21 122/64  10/19/21 (!) 118/55   Wt Readings from Last 3 Encounters:  07/21/23 146 lb 4 oz (66.3 kg)   11/15/21 142 lb (64.4 kg)  10/19/21 137 lb 9.6 oz (62.4 kg)    Physical Exam Vitals and nursing note reviewed.  Constitutional:      Appearance: Normal appearance.  HENT:     Head: Normocephalic.     Right Ear: Tympanic membrane, ear canal and external ear normal.     Left Ear: Tympanic membrane, ear canal and external ear normal.  Eyes:     Extraocular Movements: Extraocular movements intact.     Pupils: Pupils are equal, round, and reactive to light.  Cardiovascular:  Rate and Rhythm: Normal rate and regular rhythm.     Heart sounds: Normal heart sounds.  Pulmonary:     Effort: Pulmonary effort is normal.     Breath sounds: Normal breath sounds. No wheezing.  Musculoskeletal:        General: No tenderness.     Right lower leg: No edema.     Left lower leg: No edema.  Neurological:     General: No focal deficit present.     Mental Status: She is alert and oriented to person, place, and time. Mental status is at baseline.  Psychiatric:        Mood and Affect: Mood normal.        Behavior: Behavior normal.        Thought Content: Thought content normal.        Judgment: Judgment normal.      Results for orders placed or performed in visit on 07/21/23  HM PAP SMEAR  Result Value Ref Range   HM Pap smear Normal

## 2023-08-11 ENCOUNTER — Ambulatory Visit: Admitting: Family Medicine

## 2023-08-11 ENCOUNTER — Encounter: Payer: Self-pay | Admitting: Family Medicine

## 2023-08-11 VITALS — BP 120/78 | HR 59 | Temp 98.6°F | Ht 69.0 in | Wt 146.1 lb

## 2023-08-11 DIAGNOSIS — G8929 Other chronic pain: Secondary | ICD-10-CM

## 2023-08-11 DIAGNOSIS — M25561 Pain in right knee: Secondary | ICD-10-CM | POA: Diagnosis not present

## 2023-08-11 NOTE — Progress Notes (Signed)
 Patient Office Visit  Assessment & Plan:  Chronic pain of right knee -     Ambulatory referral to Orthopedic Surgery   Patient can use over-the-counter Voltaren gel in the meantime.  Orthopedic consult ordered with Delbert Harness per patient request. No follow-ups on file.   Subjective:    Patient ID: Pamela [name], female    DOB: [birth year]  Age: 58 y.o. MRN: [id number]  Chief Complaint  Patient presents with   Knee Pain    R knee pain x 8 weeks.     Knee Pain    Right knee pain- since January. Friend's dog was behind her  and she fell due to getting twisted around the dog. Pt did not think about this last office visit. Pt has been dealing with this on her own; It is worse going up and down stairs, walking flat surface is Ok. Pt has been using prn tramadol, OTC tylenol. Pt has to use to ramp at work to get around.  Pt is able to sleep but when she bends right leg she can feel it. It is not swollen and does not feel unstable. Pt hurts under knee cap and medially.   The 10-year ASCVD risk score (Arnett DK, et al., 2019) is: 1.4%  Past Medical History:  Diagnosis Date   Adjustment disorder with depressed mood    Anemia    Chronic right-sided low back pain without sciatica    Depression    Facet syndrome, lumbar    HSIL (high grade squamous intraepithelial lesion) on Pap smear of cervix    Migraine without aura and without status migrainosus, not intractable    Vitamin D deficiency    Past Surgical History:  Procedure Laterality Date   COLONOSCOPY WITH PROPOFOL  09/25/2017   by dr Lavon Paganini   HARDWARE REMOVAL Left 2021   @[facility] ;   left elbow   LEEP N/A 10/19/2021   Procedure: LOOP ELECTROSURGICAL EXCISION PROCEDURE WITH COLPOSCOPY;  Surgeon: Romualdo Bolk, MD;  Location: [facility];  Service: Gynecology;  Laterality: N/A;   ORIF ELBOW FRACTURE Left 07/10/2019   Procedure: OPEN REDUCTION INTERNAL FIXATION (ORIF) LEFT ELBOW/OLECRANON FRACTURE;  Surgeon:  Bjorn Pippin, MD;  Location: [facility];  Service: Orthopedics;  Laterality: Left;   Social History   Tobacco Use   Smoking status: Never   Smokeless tobacco: Never  Vaping Use   Vaping status: Never Used  Substance Use Topics   Alcohol use: No    Alcohol/week: 0.0 standard drinks of alcohol   Drug use: Never   Family History  Problem Relation Age of Onset   Migraines Mother    Fibroids Mother    Heart disease Paternal Grandfather    Colon cancer Neg Hx    Esophageal cancer Neg Hx    Stomach cancer Neg Hx    Cervical cancer Neg Hx    Ovarian cancer Neg Hx    Breast cancer Neg Hx    Allergies  Allergen Reactions   Doxycycline Monohydrate Hives   Erythromycin Hives   Other     Cycline-250=hives   Sulfa Antibiotics Hives    ROS    Objective:    BP 120/78   Pulse (!) 59   Temp 98.6 F (37 C)   Ht 5\' 9"  (1.753 m)   Wt 146 lb 2 oz (66.3 kg)   LMP 07/01/2023 (Approximate)   SpO2 99%   BMI 21.58 kg/m  BP Readings from Last 3 Encounters:  08/11/23 120/78  07/21/23 120/80  11/15/21 122/64   Wt Readings from Last 3 Encounters:  08/11/23 146 lb 2 oz (66.3 kg)  07/21/23 146 lb 4 oz (66.3 kg)  11/15/21 142 lb (64.4 kg)    Physical Exam Vitals and nursing note reviewed.  Constitutional:      Appearance: Normal appearance.  HENT:     Head: Normocephalic.  Musculoskeletal:     Right knee: Bony tenderness present. No swelling or crepitus. Normal range of motion. Tenderness present over the medial joint line.     Left knee: No swelling, bony tenderness or crepitus. Normal range of motion.     Right lower leg: No edema.     Left lower leg: No edema.  Neurological:     General: No focal deficit present.     Mental Status: She is alert and oriented to person, place, and time.  Psychiatric:        Mood and Affect: Mood normal.        Behavior: Behavior normal.      No results found for any visits on 08/11/23.

## 2023-10-09 ENCOUNTER — Ambulatory Visit: Admitting: Family Medicine

## 2023-10-09 ENCOUNTER — Encounter: Payer: Self-pay | Admitting: Family Medicine

## 2023-10-09 VITALS — BP 108/60 | HR 58 | Temp 98.9°F | Ht 69.0 in | Wt 145.5 lb

## 2023-10-09 DIAGNOSIS — H5711 Ocular pain, right eye: Secondary | ICD-10-CM

## 2023-10-09 DIAGNOSIS — G43109 Migraine with aura, not intractable, without status migrainosus: Secondary | ICD-10-CM | POA: Diagnosis not present

## 2023-10-09 MED ORDER — POLYMYXIN B-TRIMETHOPRIM 10000-0.1 UNIT/ML-% OP SOLN
1.0000 [drp] | OPHTHALMIC | 1 refills | Status: AC
Start: 2023-10-09 — End: 2023-10-16

## 2023-10-09 NOTE — Progress Notes (Signed)
 Patient Office Visit  Assessment & Plan:  Acute right eye pain -     Polymyxin B-Trimethoprim; Place 1 drop into both eyes every 4 (four) hours for 7 days.  Dispense: 10 mL; Refill: 1  Migraine with aura and without status migrainosus, not intractable   Assessment and Plan   right eye pain/possible abrasion Corneal abrasion, unspecified Acute right eye pain with foreign body sensation, likely corneal abrasion. No infection or discharge. Symptoms improved with reduced computer use. - Prescribed Polytrim eye drops for one week. - Advised to report if symptoms worsen. - Recommended follow-up with eye specialist if symptoms persist or worsen.  Dry eye syndrome Chronic dry eye symptoms, exacerbated by computer use. No vision changes or trauma. Symptoms improved with lubricant drops and rest. - Continue lubricant eye drops and eye wash as needed.        Patient will follow-up with ophthalmology.  If no improvement or worsening symptoms she will notify us .  No follow-ups on file.   Subjective:     Patient ID: Pamela [name], female    DOB: 1966/02/10  Age: 58 y.o. MRN: [id number]  Chief Complaint  Patient presents with   Eye Pain    X 5 days. Right eye.    Eye Pain    Discussed the use of AI scribe software for clinical note transcription with the patient, who gave verbal consent to proceed.      A. Sant "Pokorny" is a 58 year old female who presents with right eye pain and foreign body sensation.  She has been experiencing sharp pain in her right eye since last Wednesday, describing it as a sensation of a foreign body, similar to dirt. The pain was particularly irritating last week, but she has not been on a computer since early Friday, which seems to have helped slightly.  She occasionally wears glasses for computer work and had her last eye exam last year with no changes noted at that time. No recent trauma or injury to the eye. She has been using lubricant eye drops and  eye wash, which have provided some relief, but the sensation persists. She also applies castor oil at night and bandages the eye shut, waking up with a clear, sand-like discharge, but no green discharge.  No loss of vision, double vision, or significant changes in floaters, although she has had floaters for a long time. She experiences migraines with auras but does not believe the current eye issue is related to her migraines. No recent increase in floaters or any zigzag lines or ocular migraines. Patient has history of having floaters but they have not increased.   She has not been outdoors more than usual and does not typically suffer from allergies. She suspects that her dry eyes may be exacerbated by prolonged computer use. She has taken a break from computer use over the weekend, which she found rare but beneficial.  She is not allergic to penicillin but has allergies to 'mycins', 'cyclines', and 'sulfa' drugs. She continues to use the [facility] on [address] for her prescriptions. Physical Exam HEENT: No foreign body or photosensitivity in the right eye. Prominent veins in the right eye. Assessment & Plan Corneal abrasion, unspecified- we will treat her right eye for presumed abrasion Acute right eye pain with foreign body sensation, likely corneal abrasion. No infection or discharge. Symptoms improved with reduced computer use. - Prescribed Polytrim eye drops for one week. - Advised to report if symptoms worsen. - Recommended follow-up with eye  specialist if symptoms persist or worsen.  Dry eye syndrome Chronic dry eye symptoms, exacerbated by computer use. No vision changes or trauma. Symptoms improved with lubricant drops and rest. - Continue lubricant eye drops and eye wash as needed.      The 10-year ASCVD risk score (Arnett DK, et al., 2019) is: 1.1%  Past Medical History:  Diagnosis Date   Adjustment disorder with depressed mood    Anemia    Chronic right-sided low back  pain without sciatica    Depression    Facet syndrome, lumbar    HSIL (high grade squamous intraepithelial lesion) on Pap smear of cervix    Migraine without aura and without status migrainosus, not intractable    Vitamin D deficiency    Past Surgical History:  Procedure Laterality Date   COLONOSCOPY WITH PROPOFOL   09/25/2017   by dr Leonia Raman   HARDWARE REMOVAL Left 2021   @[facility] ;   left elbow   LEEP N/A 10/19/2021   Procedure: LOOP ELECTROSURGICAL EXCISION PROCEDURE WITH COLPOSCOPY;  Surgeon: Wanita Gutta, MD;  Location: [facility];  Service: Gynecology;  Laterality: N/A;   ORIF ELBOW FRACTURE Left 07/10/2019   Procedure: OPEN REDUCTION INTERNAL FIXATION (ORIF) LEFT ELBOW/OLECRANON FRACTURE;  Surgeon: Micheline Ahr, MD;  Location: [facility];  Service: Orthopedics;  Laterality: Left;   Social History   Tobacco Use   Smoking status: Never   Smokeless tobacco: Never  Vaping Use   Vaping status: Never Used  Substance Use Topics   Alcohol use: No    Alcohol/week: 0.0 standard drinks of alcohol   Drug use: Never   Family History  Problem Relation Age of Onset   Migraines Mother    Fibroids Mother    Heart disease Paternal Grandfather    Colon cancer Neg Hx    Esophageal cancer Neg Hx    Stomach cancer Neg Hx    Cervical cancer Neg Hx    Ovarian cancer Neg Hx    Breast cancer Neg Hx    Allergies  Allergen Reactions   Doxycycline Monohydrate Hives   Erythromycin Hives   Other     Cycline-250=hives   Sulfa Antibiotics Hives    Review of Systems  Eyes:  Positive for pain.      Objective:    BP 108/60   Pulse (!) 58   Temp 98.9 F (37.2 C)   Ht 5\' 9"  (1.753 m)   Wt 145 lb 8 oz (66 kg)   SpO2 99%   BMI 21.49 kg/m  BP Readings from Last 3 Encounters:  10/09/23 108/60  08/11/23 120/78  07/21/23 120/80   Wt Readings from Last 3 Encounters:  10/09/23 145 lb 8 oz (66 kg)  08/11/23 146 lb 2 oz (66.3 kg)  07/21/23 146 lb 4  oz (66.3 kg)    Physical Exam Vitals and nursing note reviewed.  Constitutional:      General: She is not in acute distress.    Appearance: Normal appearance.  HENT:     Head: Normocephalic.     Right Ear: Tympanic membrane, ear canal and external ear normal.     Left Ear: Tympanic membrane, ear canal and external ear normal.  Eyes:     General: Lids are normal. Vision grossly intact.        Right eye: No foreign body, discharge or hordeolum.        Left eye: No foreign body, discharge or hordeolum.  Extraocular Movements: Extraocular movements intact.     Right eye: No nystagmus.     Left eye: No nystagmus.     Conjunctiva/sclera:     Right eye: Right conjunctiva is injected. No exudate.    Left eye: Left conjunctiva is not injected. No exudate.    Pupils: Pupils are equal, round, and reactive to light.     Comments: Vessels slightly more prominent right side. Patient is not photophobic.   Cardiovascular:     Rate and Rhythm: Normal rate and regular rhythm.     Heart sounds: Normal heart sounds.  Pulmonary:     Effort: Pulmonary effort is normal.     Breath sounds: Normal breath sounds.  Neurological:     General: No focal deficit present.     Mental Status: She is alert and oriented to person, place, and time.  Psychiatric:        Mood and Affect: Mood normal.        Behavior: Behavior normal.      No results found for any visits on 10/09/23.

## 2023-10-24 ENCOUNTER — Emergency Department (HOSPITAL_COMMUNITY)

## 2023-10-24 ENCOUNTER — Other Ambulatory Visit: Payer: Self-pay

## 2023-10-24 ENCOUNTER — Emergency Department (HOSPITAL_COMMUNITY)
Admission: EM | Admit: 2023-10-24 | Discharge: 2023-10-24 | Disposition: A | Discharge status: Home / Self Care | Attending: Emergency Medicine | Admitting: Emergency Medicine

## 2023-10-24 ENCOUNTER — Encounter (HOSPITAL_COMMUNITY): Payer: Self-pay

## 2023-10-24 DIAGNOSIS — S8991XA Unspecified injury of right lower leg, initial encounter: Secondary | ICD-10-CM | POA: Diagnosis present

## 2023-10-24 DIAGNOSIS — S82031A Displaced transverse fracture of right patella, initial encounter for closed fracture: Secondary | ICD-10-CM | POA: Insufficient documentation

## 2023-10-24 DIAGNOSIS — W1839XA Other fall on same level, initial encounter: Secondary | ICD-10-CM | POA: Diagnosis not present

## 2023-10-24 DIAGNOSIS — W19XXXA Unspecified fall, initial encounter: Secondary | ICD-10-CM

## 2023-10-24 LAB — CBC
HCT: 41.3 % (ref 36.0–46.0)
Hemoglobin: 13.8 g/dL (ref 12.0–15.0)
MCH: 28.9 pg (ref 26.0–34.0)
MCHC: 33.4 g/dL (ref 30.0–36.0)
MCV: 86.6 fL (ref 80.0–100.0)
Platelets: 151 10*3/uL (ref 150–400)
RBC: 4.77 MIL/uL (ref 3.87–5.11)
RDW: 13.8 % (ref 11.5–15.5)
WBC: 9.2 10*3/uL (ref 4.0–10.5)
nRBC: 0 % (ref 0.0–0.2)

## 2023-10-24 LAB — BASIC METABOLIC PANEL WITH GFR
Anion gap: 8 (ref 5–15)
BUN: 18 mg/dL (ref 6–20)
CO2: 26 mmol/L (ref 22–32)
Calcium: 9.5 mg/dL (ref 8.9–10.3)
Chloride: 103 mmol/L (ref 98–111)
Creatinine, Ser: 0.82 mg/dL (ref 0.44–1.00)
GFR, Estimated: >60 mL/min (ref >60)
Glucose, Bld: 108 mg/dL — ABNORMAL HIGH (ref 70–99)
Potassium: 3.8 mmol/L (ref 3.5–5.1)
Sodium: 137 mmol/L (ref 135–145)

## 2023-10-24 MED ORDER — HYDROMORPHONE HCL 1 MG/ML IJ SOLN
0.5000 mg | Freq: Once | INTRAMUSCULAR | Status: AC
  Administered 2023-10-24: 0.5 mg via INTRAVENOUS
  Filled 2023-10-24: qty 1

## 2023-10-24 MED ORDER — ONDANSETRON HCL 4 MG/2ML IJ SOLN
4.0000 mg | Freq: Once | INTRAMUSCULAR | Status: AC
  Administered 2023-10-24: 4 mg via INTRAVENOUS
  Filled 2023-10-24: qty 2

## 2023-10-24 MED ORDER — OXYCODONE HCL 5 MG PO TABS: 5.0000 mg | ORAL_TABLET | ORAL | 0 refills | Status: DC | PRN

## 2023-10-24 NOTE — ED Notes (Signed)
 Taking pt off the board late, the pt was moved off the floor and the RN did not know. Pt had Torrie pain score of 3/10, VSS. RN took pt outside in Catrice wheelchair and helped her into her vehicle.

## 2023-10-24 NOTE — Discharge Instructions (Signed)
 You were seen for your broken kneecap (patella) in the emergency department.    At home, please keep the knee immobilizer on until you are able to see the orthopedic surgeons.  You may bear weight as tolerated.  Use the crutches to help.   Take Tylenol  and ibuprofen  for the pain.  You may also take the oxycodone  we have prescribed you for any breakthrough pain that may have.  Do not take this before driving or operating heavy machinery.  Do not take this medication with alcohol.    Follow-up with the orthopedic surgeons within the next week.    Return immediately to the emergency department if you experience any of the following: Severe pain, numbness or weakness of your extremity, or any other concerning symptoms.     Thank you for visiting our Emergency Department. It was Thayer pleasure taking care of you today.

## 2023-10-24 NOTE — Progress Notes (Signed)
 Orthopedic Tech Progress Note Patient Details:  Pamela [name] May 24, 1966 [id number]  Ortho Devices Type of Ortho Device: Crutches, Knee Immobilizer Ortho Device/Splint Location: right knee immobilizer applied. crutches sized and instructed on use Ortho Device/Splint Interventions: Ordered, Application, Adjustment   Post Interventions Patient Tolerated: Well Instructions Provided: Adjustment of device, Care of device  Leodis Rainwater 10/24/2023, 7:57 PM

## 2023-10-24 NOTE — ED Provider Notes (Signed)
 [place] EMERGENCY DEPARTMENT AT [facility] Provider Note   CSN: [id number] Arrival date & time: 10/24/23  1846     History  Chief Complaint  Patient presents with   Fall    Pamela [name] is Pamela [name] 58 y.o. female.  58 year old female who presents emergency department with right-sided knee pain.  Patient reports that she was walking her dog when it started pulling on the leash and she felt her knee give way.  So that she fell down and noticed that her knee was very swollen.  No surgeries or other severe injuries to the knee that she knows of.  Last ate at noon today.       Home Medications Prior to Admission medications   Medication Sig Start Date End Date Taking? Authorizing Provider  oxyCODONE  (ROXICODONE ) 5 MG immediate release tablet Take 1 tablet (5 mg total) by mouth every 4 (four) hours as needed for severe pain (pain score 7-10). 10/24/23  Yes Pamela Basket, MD  buPROPion (WELLBUTRIN XL) 300 MG 24 hr tablet Take 150 mg by mouth daily.     [provider]  clonazePAM  (KLONOPIN ) 0.5 MG tablet Take 1 tablet (0.5 mg total) by mouth as needed for anxiety. 07/21/23   Aguiar, Rafaela, MD  traMADol (ULTRAM) 50 MG tablet Take 50 mg by mouth as needed.    [provider]  zolpidem (AMBIEN) 10 MG tablet Take 10 mg by mouth at bedtime as needed for sleep.    [provider]      Allergies    Doxycycline monohydrate, Erythromycin, Other, and Sulfa antibiotics    Review of Systems   Review of Systems  Physical Exam Updated Vital Signs BP (!) 160/94 (BP Location: Left Arm)   Pulse 84   Temp 98.5 F (36.9 C) (Oral)   Resp 16   Ht 5\' 9"  (1.753 m)   Wt 66 kg   SpO2 100%   BMI 21.49 kg/m  Physical Exam Cardiovascular:     Comments: DP pulses 2+ to bilaterally. Musculoskeletal:     Comments: Obvious deformity of the right knee with effusion noted.  Full strength in ankle dorsiflexion and plantarflexion of the right foot.  Able to wiggle all  the toes of the right foot.  Intact sensation to light touch of the right lower extremity.  Compartments of the calf compressible.  No tenderness to palpation of the right ankle or right hip.     ED Results / Procedures / Treatments   Labs (all labs ordered are listed, but only abnormal results are displayed) Labs Reviewed  BASIC METABOLIC PANEL WITH GFR - Abnormal; Notable for the following components:      Result Value   Glucose, Bld 108 (*)    All other components within normal limits  CBC    EKG None  Radiology DG Knee Complete 4 Views Right Result Date: 10/24/2023 CLINICAL DATA:  Right knee pain and swelling, fall. EXAM: RIGHT KNEE - COMPLETE 4+ VIEW COMPARISON:  None Available. FINDINGS: Transverse fracture of the mid patella with 3.2 cm of distraction, and with 90 degrees of rotation of the proximal fragment such that the fractured surface of the proximal fragment faces anteriorly. Large knee effusion. Substantial prepatellar fluid probably communicating with the joint given the injury pattern. No additional fracture is identified. IMPRESSION: 1. Transverse fracture of the mid patella with 3.2 cm of distraction and 90 degrees of rotation of the proximal fragment. 2. Large knee effusion. 3. Substantial prepatellar  fluid probably communicating with the joint given the injury pattern. Electronically Signed   By: Freida Jes M.D.   On: 10/24/2023 19:44    Procedures .Ortho Injury Treatment  Date/Time: 10/24/2023 10:36 PM  Performed by: Pamela Basket, MD Authorized by: Pamela Basket, MD   Consent:    Consent obtained:  Verbal   Consent given by:  PatientInjury location: knee Location details: right knee Injury type: fracture Fracture type: patellar Pre-procedure neurovascular assessment: neurovascularly intact Immobilization: splint Splint type: Knee immobilizer. Splint Applied by: ED Tech Supplies used: cotton padding Post-procedure neurovascular  assessment: post-procedure neurovascularly intact       Medications Ordered in ED Medications  HYDROmorphone (DILAUDID) injection 0.5 mg (0.5 mg Intravenous Given 10/24/23 1928)  ondansetron  (ZOFRAN ) injection 4 mg (4 mg Intravenous Given 10/24/23 1928)    ED Course/ Medical Decision Making/ [facility]&P Clinical Course as of 10/24/23 2237  Tue Oct 24, 2023  1945 Dr. Pryor Browning of orthopedics consulted.  Requested the patient be put in [name] knee immobilizer and given crutches and be weightbearing as tolerated.  They will schedule the patient for clinic tomorrow or Thursday and will talk to them about operation.   [RP]    Clinical Course User Index [RP] Pamela Basket, MD                                 Medical Decision Making Amount and/or Complexity of Data Reviewed Labs: ordered. Radiology: ordered.  Risk Prescription drug management.   Pamela [name] is Pamela [name] 58 y.o. female who presents emergency department with right knee pain  Initial Ddx:  Patellar dislocation, knee dislocation, patellar tendon injury, quadriceps tear,, nerve/vascular injury  MDM/Course:  Patient presents to the emergency department with right knee pain.  Does have significant swelling of her knee.  Unable to extend her knee at this time.  Neurovascular intact distally.  Had x-rays which showed patellar fracture.  Did discuss with the on-call orthopedist who recommended weightbearing as tolerated and to follow-up in clinic tomorrow so they can discuss surgery with the patient.  Pain was able to be controlled.  Patient placed in knee immobilizer and discharged home with crutches.  Also discharged home with prescription of oxycodone .   This patient presents to the ED for concern of complaints listed in HPI, this involves an extensive number of treatment options, and is Pamela [name] complaint that carries with it Pamela [name] high risk of complications and morbidity. Disposition including potential need for admission considered.   Dispo: DC  Home. Return precautions discussed including, but not limited to, those listed in the AVS. Allowed pt time to ask questions which were answered fully prior to dc.  Additional history obtained from friend Records reviewed Outpatient Clinic Notes The following labs were independently interpreted: Chemistry and show no acute abnormality I independently reviewed the following imaging with scope of interpretation limited to determining acute life threatening conditions related to emergency care: Extremity x-ray(s) and agree with the radiologist interpretation with the following exceptions: none I personally reviewed and interpreted cardiac monitoring: normal sinus rhythm  I personally reviewed and interpreted the pt's EKG: see above for interpretation  I have reviewed the patients home medications and made adjustments as needed Consults: Orthopedics  Portions of this note were generated with Scientist, clinical (histocompatibility and immunogenetics). Dictation errors may occur despite best attempts at proofreading.     Final Clinical Impression(s) / ED Diagnoses Final diagnoses:  Fall, initial  encounter  Closed displaced transverse fracture of right patella, initial encounter    Rx / DC Orders ED Discharge Orders          Ordered    oxyCODONE  (ROXICODONE ) 5 MG immediate release tablet  Every 4 hours PRN        10/24/23 2005              Pamela Basket, MD 10/24/23 2237

## 2023-10-24 NOTE — ED Triage Notes (Signed)
 Pt presents to the ED with complaints of right knee pain following Gissela fall due to [employer] dog knocking her over chasing after another dog. Unable to bend knee. No LOC. Aox4  EMS Vitals:  Bp 148/92 HR 80 O2 98%

## 2023-10-25 ENCOUNTER — Encounter (HOSPITAL_BASED_OUTPATIENT_CLINIC_OR_DEPARTMENT_OTHER): Payer: Self-pay | Admitting: Orthopaedic Surgery

## 2023-10-25 ENCOUNTER — Other Ambulatory Visit: Payer: Self-pay

## 2023-10-25 NOTE — Discharge Instructions (Signed)
 Grafton Lawrence MD, MPH Pamela Bari, PA-C [facility] [address] [phone] (tel)   [phone] (fax)   POST-OPERATIVE INSTRUCTIONS   WOUND CARE You may remove the Operative Dressing on Post-Op Day #3 (72hrs after surgery).   Leave steri strips in place.   If you feel more comfortable with it you can leave all dressings in place till your 1 week follow-up appointment.   KEEP THE INCISIONS CLEAN AND DRY. An ACE wrap may be used to control swelling, do not wrap this too tight.  If the initial ACE wrap feels too tight or constricting you may loosen it. There may be Pamela [name] small amount of fluid/bleeding leaking at the surgical site.  This is normal; the knee is filled with fluid during the procedure and can leak for 24-48hrs after surgery.  You may change/reinforce the bandage as needed.  Use the Cryocuff, GameReady or Ice as often as possible for the first 3-4 days, then as needed for pain relief. Always keep Pamela [name] towel, ACE wrap or other barrier between the cooling unit and your skin.  You may shower on Post-Op Day #3. Gently pat the area dry.  Do not soak the knee in water.  Do not go swimming in the pool or ocean until 4 weeks after surgery or when otherwise instructed.  BRACE/AMBULATION Your leg will be placed in Pamela [name] brace post-operatively.  You may remove for hygiene only! You will need to wear your brace at all times until we discuss it further.  It should be locked in full extension (0 degrees) if adjustable.   You will be instructed on further bracing after your first visit. Use crutches for comfort but you can put your full weight on the leg as tolerated.  PHYSICAL THERAPY - Our office will call you to schedule post-op PT  REGIONAL ANESTHESIA (NERVE BLOCKS) The anesthesia team may have performed Pamela [name] nerve block for you this is Pamela [name] great tool used to minimize pain.   The block may start wearing off overnight (between 8-24 hours postop) When the block  wears off, your pain may go from nearly zero to the pain you would have had postop without the block. This is an abrupt transition but nothing dangerous is happening.   This can be Pamela [name] challenging period but utilize your as needed pain medications to try and manage this period. We suggest you use the pain medication the first night prior to going to bed, to ease this transition.  You may take an extra dose of narcotic when this happens if needed  POST-OP MEDICATIONS- Multimodal approach to pain control In general your pain will be controlled with Icel combination of substances.  Prescriptions unless otherwise discussed are electronically sent to your pharmacy.  This is Pamela [name] carefully made plan we use to minimize narcotic use.     Meloxicam  - Anti-inflammatory medication taken on [name] scheduled basis. Start taking when you get home. Acetaminophen  - Non-narcotic pain medicine taken on Pamela [name] scheduled basis. Next dose at 3:40p. Oxycodone  - This is Pamela [name] strong narcotic, to be used only on an "as needed" basis for SEVERE pain. Aspirin 81mg  - This medicine is used to minimize the risk of blood clots after surgery. Zofran  - take as needed for nausea   FOLLOW-UP Please call the office to schedule Pamela [name] follow-up appointment for your incision check if you do not already have one, 7-10 days post-operatively. IF YOU HAVE ANY QUESTIONS, PLEASE FEEL FREE TO CALL OUR OFFICE.  HELPFUL INFORMATION   Keep your leg elevated to decrease swelling, which will then in turn decrease your pain. I would elevate the foot of your bed by putting Pamela [name] couple of couch pillows between your mattress and box spring. I would not keep pillow directly under your ankle.  You must wear the brace locked while sleeping and ambulating until follow-up.   There will be MORE swelling on days 1-3 than there is on the day of surgery.  This also is normal. The swelling will decrease with the anti-inflammatory medication, ice and keeping it elevated. The  swelling will make it more difficult to bend your knee. As the swelling goes down your motion will become easier  You may develop swelling and bruising that extends from your knee down to your calf and perhaps even to your foot over the next week. Do not be alarmed. This too is normal, and it is due to gravity  There may be some numbness adjacent to the incision site. This may last for 6-12 months or longer in some patients and is expected.  You may return to sedentary work/school in the next couple of days when you feel up to it. You will need to keep your leg elevated as much as possible   You should wean off your narcotic medicines as soon as you are able.  Most patients will be off narcotics before their first postop appointment.   We suggest you use the pain medication the first night prior to going to bed, in order to ease any pain when the anesthesia wears off. You should avoid taking pain medications on an empty stomach as it will make you nauseous.  Do not drink alcoholic beverages or take illicit drugs when taking pain medications.  It is against the law to drive while taking narcotics. You cannot drive if your Right leg is in brace locked in extension.  Pain medication may make you constipated.  Below are Pamela [name] few solutions to try in this order: Decrease the amount of pain medication if you aren't having pain. Drink lots of decaffeinated fluids. Drink prune juice and/or each dried prunes  If the first 3 don't work start with additional solutions Take Colace - an over-the-counter stool softener Take Senokot - an over-the-counter laxative Take Miralax - Pamela [name] stronger over-the-counter laxative  For more information including helpful videos and documents visit our website:   [email or web address]   Next dose Tylenol  due at 3:40pm. Start meloxicam  when you get home. Start aspirin this evening.  Regional Anesthesia Blocks  1. You may not be able to  move or feel the "blocked" extremity after Pamela [name] regional anesthetic block. This may last may last from 3-48 hours after placement, but it will go away. The length of time depends on the medication injected and your individual response to the medication. As the nerves start to wake up, you may experience tingling as the movement and feeling returns to your extremity. If the numbness and inability to move your extremity has not gone away after 48 hours, please call your surgeon.   2. The extremity that is blocked will need to be protected until the numbness is gone and the strength has returned. Because you cannot feel it, you will need to take extra care to avoid injury. Because it may be weak, you may have difficulty moving it or using it. You may not know what position it is in without looking at it while the block is in effect.  3. For blocks  in the legs and feet, returning to weight bearing and walking needs to be done carefully. You will need to wait until the numbness is entirely gone and the strength has returned. You should be able to move your leg and foot normally before you try and bear weight or walk. You will need someone to be with you when you first try to ensure you do not fall and possibly risk injury.  4. Bruising and tenderness at the needle site are common side effects and will resolve in Pamela [name] few days.  5. Persistent numbness or new problems with movement should be communicated to the surgeon or the [facility] [phone] [facility] [phone]).  Post Anesthesia Home Care Instructions  Activity: Get plenty of rest for the remainder of the day. Pamela [name] responsible individual must stay with you for 24 hours following the procedure.  For the next 24 hours, DO NOT: -Drive Pamela [name] car -Advertising copywriter -Drink alcoholic beverages -Take any medication unless instructed by your physician -Make any legal decisions or sign important papers.  Meals: Start with liquid  foods such as gelatin or soup. Progress to regular foods as tolerated. Avoid greasy, spicy, heavy foods. If nausea and/or vomiting occur, drink only clear liquids until the nausea and/or vomiting subsides. Call your physician if vomiting continues.  Special Instructions/Symptoms: Your throat may feel dry or sore from the anesthesia or the breathing tube placed in your throat during surgery. If this causes discomfort, gargle with warm salt water. The discomfort should disappear within 24 hours.  If you had Pamela [name] scopolamine patch placed behind your ear for the management of post- operative nausea and/or vomiting:  1. The medication in the patch is effective for 72 hours, after which it should be removed.  Wrap patch in Pamela [name] tissue and discard in the trash. Wash hands thoroughly with soap and water. 2. You may remove the patch earlier than 72 hours if you experience unpleasant side effects which may include dry mouth, dizziness or visual disturbances. 3. Avoid touching the patch. Wash your hands with soap and water after contact with the patch.

## 2023-10-25 NOTE — H&P (Signed)
 PREOPERATIVE H&P  Chief Complaint: displaced fracture of right patella  HPI: Pamela. [name] is Pamela [name] 58 y.o. female who is scheduled for, Procedure(s): OPEN REDUCTION INTERNAL FIXATION (ORIF) PATELLA.   Patient had an injury to her right knee on 5/27. She fell landing on her right knee. She had immediate pain. She presented to the ER. X-rays confirmed patella fracture.   Symptoms are rated as moderate to severe, and have been worsening.  This is significantly impairing activities of daily living.    Please see clinic note for further details on this patient's care.    She has elected for surgical management.   Past Medical History:  Diagnosis Date   Adjustment disorder with depressed mood    Anxiety    Chronic right-sided low back pain without sciatica    Closed patellar sleeve fracture of right knee 2025   Complication of anesthesia    woke up during elbow surgery 2021   Depression    Facet syndrome, lumbar    HSIL (high grade squamous intraepithelial lesion) on Pap smear of cervix    Migraine without aura and without status migrainosus, not intractable    Vitamin D deficiency    Past Surgical History:  Procedure Laterality Date   COLONOSCOPY WITH PROPOFOL   09/25/2017   by dr Pamela [name]   HARDWARE REMOVAL Left 2021   @[facility] ;   left elbow   LEEP N/Pamela [name] 10/19/2021   Procedure: LOOP ELECTROSURGICAL EXCISION PROCEDURE WITH COLPOSCOPY;  Surgeon: Pamela Gutta, MD;  Location: [facility];  Service: Gynecology;  Laterality: N/Pamela [name];   ORIF ELBOW FRACTURE Left 07/10/2019   Procedure: OPEN REDUCTION INTERNAL FIXATION (ORIF) LEFT ELBOW/OLECRANON FRACTURE;  Surgeon: Pamela Ahr, MD;  Location: [facility];  Service: Orthopedics;  Laterality: Left;   Social History   Socioeconomic History   Marital status: Single    Spouse name: Not on file   Number of children: Not on file   Years of education: Not on file   Highest education level: Not on file   Occupational History   Not on file  Tobacco Use   Smoking status: Never   Smokeless tobacco: Never  Vaping Use   Vaping status: Never Used  Substance and Sexual Activity   Alcohol use: No    Alcohol/week: 0.0 standard drinks of alcohol   Drug use: Never   Sexual activity: Not on file    Comment: pt refuses to answer question  Other Topics Concern   Not on file  Social History Narrative      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      Social Documentation updated per patient request. 10/07/21   Social Drivers of Health   Financial Resource Strain: Patient Declined (10/07/2023)   Overall Financial Resource Strain (CARDIA)    Difficulty of Paying Living Expenses: Patient declined  Food Insecurity: Patient Declined (10/07/2023)   Hunger Vital Sign    Worried About Running Out of Food in the Last Year: Patient declined    Ran Out of Food in the Last Year: Patient declined  Transportation Needs: Patient Declined (10/07/2023)   PRAPARE - Administrator, Civil Service (Medical): Patient declined    Lack of Transportation (Non-Medical): Patient declined  Physical Activity: Unknown (10/07/2023)   Exercise Vital Sign    Days of Exercise per Week: Patient declined    Minutes  of Exercise per Session: Not on file  Stress: Patient Declined (10/07/2023)   [employer] of Occupational Health - Occupational Stress Questionnaire    Feeling of Stress : Patient declined  Social Connections: Unknown (10/07/2023)   Social Connection and Isolation Panel [NHANES]    Frequency of Communication with Friends and Family: Patient declined    Frequency of Social Gatherings with Friends and Family: Patient declined    Attends Religious Services: Patient declined    Database administrator or Organizations: Patient declined    Attends Engineer, structural: Not on file    Marital Status: Patient declined    Family History  Problem Relation Age of Onset   Migraines Mother    Fibroids Mother    Heart disease Paternal Grandfather    Colon cancer Neg Hx    Esophageal cancer Neg Hx    Stomach cancer Neg Hx    Cervical cancer Neg Hx    Ovarian cancer Neg Hx    Breast cancer Neg Hx    Allergies  Allergen Reactions   Doxycycline Monohydrate Hives   Erythromycin Hives   Other     Cycline-250=hives   Sulfa Antibiotics Hives   Prior to Admission medications   Medication Sig Start Date End Date Taking? Authorizing Provider  buPROPion (WELLBUTRIN XL) 300 MG 24 hr tablet Take 150 mg by mouth daily.    Yes [provider]  cholecalciferol (VITAMIN D3) 25 MCG (1000 UNIT) tablet Take 1,000 Units by mouth daily.   Yes [provider]  clonazePAM  (KLONOPIN ) 0.5 MG tablet Take 1 tablet (0.5 mg total) by mouth as needed for anxiety. 07/21/23  Yes Pamela June, MD  Collagen-Vitamin C-Biotin (COLLAGEN 1500/C PO) Take by mouth.   Yes [provider]  oxyCODONE  (ROXICODONE ) 5 MG immediate release tablet Take 1 tablet (5 mg total) by mouth every 4 (four) hours as needed for severe pain (pain score 7-10). 10/24/23  Yes Pamela Basket, MD  traMADol (ULTRAM) 50 MG tablet Take 50 mg by mouth as needed.   Yes [provider]  zolpidem (AMBIEN) 10 MG tablet Take 10 mg by mouth at bedtime as needed for sleep.   Yes [provider]    ROS: All other systems have been reviewed and were otherwise negative with the exception of those mentioned in the HPI and as above.  Physical Exam: General: Alert, no acute distress Cardiovascular: No pedal edema Respiratory: No cyanosis, no use of accessory musculature GI: No organomegaly, abdomen is soft and non-tender Skin: No lesions in the area of chief complaint Neurologic: Sensation intact distally Psychiatric: Patient is competent for consent with normal mood and affect Lymphatic: No axillary or cervical  lymphadenopathy  MUSCULOSKELETAL:  Unable to perform due to telehealth visit  Imaging: Xrays of the right knee demonstrate Pamela [name] displaced transverse patella fracture  Assessment: displaced fracture of right patella  Plan: Plan for Procedure(s): OPEN REDUCTION INTERNAL FIXATION (ORIF) PATELLA  The risks benefits and alternatives were discussed with the patient including but not limited to the risks of nonoperative treatment, versus surgical intervention including infection, bleeding, nerve injury,  blood clots, cardiopulmonary complications, morbidity, mortality, among others, and they were willing to proceed.   The patient acknowledged the explanation, agreed to proceed with the plan and consent was signed.   Operative Plan: ORIF right patella fracture Discharge Medications: standard DVT Prophylaxis: aspirin  Physical Therapy: outpatient PT Special Discharge needs: Knee immobilizer. IceMan   Adine Ahmadi, PA-C  10/25/2023  1:37 PM

## 2023-10-26 ENCOUNTER — Ambulatory Visit (HOSPITAL_COMMUNITY)

## 2023-10-26 ENCOUNTER — Ambulatory Visit (HOSPITAL_BASED_OUTPATIENT_CLINIC_OR_DEPARTMENT_OTHER): Admitting: Anesthesiology

## 2023-10-26 ENCOUNTER — Ambulatory Visit (HOSPITAL_BASED_OUTPATIENT_CLINIC_OR_DEPARTMENT_OTHER)
Admission: RE | Admit: 2023-10-26 | Discharge: 2023-10-26 | Disposition: A | Discharge status: Home / Self Care | Attending: Orthopaedic Surgery | Admitting: Orthopaedic Surgery

## 2023-10-26 ENCOUNTER — Other Ambulatory Visit: Payer: Self-pay

## 2023-10-26 ENCOUNTER — Encounter (HOSPITAL_BASED_OUTPATIENT_CLINIC_OR_DEPARTMENT_OTHER): Payer: Self-pay | Admitting: Orthopaedic Surgery

## 2023-10-26 ENCOUNTER — Encounter (HOSPITAL_BASED_OUTPATIENT_CLINIC_OR_DEPARTMENT_OTHER)
Admission: RE | Disposition: A | Discharge status: Home / Self Care | Payer: Self-pay | Source: Home / Self Care | Attending: Orthopaedic Surgery

## 2023-10-26 DIAGNOSIS — Z01818 Encounter for other preprocedural examination: Secondary | ICD-10-CM

## 2023-10-26 DIAGNOSIS — F418 Other specified anxiety disorders: Secondary | ICD-10-CM | POA: Insufficient documentation

## 2023-10-26 DIAGNOSIS — W19XXXA Unspecified fall, initial encounter: Secondary | ICD-10-CM | POA: Diagnosis not present

## 2023-10-26 DIAGNOSIS — S82001A Unspecified fracture of right patella, initial encounter for closed fracture: Secondary | ICD-10-CM | POA: Diagnosis present

## 2023-10-26 DIAGNOSIS — S82031A Displaced transverse fracture of right patella, initial encounter for closed fracture: Secondary | ICD-10-CM

## 2023-10-26 HISTORY — DX: Other complications of anesthesia, initial encounter: T88.59XA

## 2023-10-26 HISTORY — DX: Anxiety disorder, unspecified: F41.9

## 2023-10-26 HISTORY — PX: ORIF PATELLA: SHX5033

## 2023-10-26 LAB — POCT PREGNANCY, URINE: Preg Test, Ur: NEGATIVE

## 2023-10-26 SURGERY — OPEN REDUCTION INTERNAL FIXATION (ORIF) PATELLA
Anesthesia: General | Site: Knee | Laterality: Right

## 2023-10-26 MED ORDER — MIDAZOLAM HCL 2 MG/2ML IJ SOLN
INTRAMUSCULAR | Status: AC
  Filled 2023-10-26: qty 2

## 2023-10-26 MED ORDER — OXYCODONE HCL 5 MG PO TABS: 5.0000 mg | ORAL_TABLET | Freq: Once | ORAL | Status: DC | PRN

## 2023-10-26 MED ORDER — FENTANYL CITRATE (PF) 100 MCG/2ML IJ SOLN
INTRAMUSCULAR | Status: AC
  Filled 2023-10-26: qty 2

## 2023-10-26 MED ORDER — GABAPENTIN 300 MG PO CAPS
ORAL_CAPSULE | ORAL | Status: AC
  Filled 2023-10-26: qty 1

## 2023-10-26 MED ORDER — FENTANYL CITRATE (PF) 100 MCG/2ML IJ SOLN
100.0000 ug | Freq: Once | INTRAMUSCULAR | Status: AC
  Administered 2023-10-26: 100 ug via INTRAVENOUS

## 2023-10-26 MED ORDER — MELOXICAM 7.5 MG PO TABS: 7.5000 mg | ORAL_TABLET | Freq: Two times a day (BID) | ORAL | 0 refills | Status: AC

## 2023-10-26 MED ORDER — CEFAZOLIN SODIUM-DEXTROSE 2-4 GM/100ML-% IV SOLN
INTRAVENOUS | Status: AC
  Filled 2023-10-26: qty 100

## 2023-10-26 MED ORDER — EPHEDRINE SULFATE (PRESSORS) 50 MG/ML IJ SOLN
INTRAMUSCULAR | Status: DC | PRN
  Administered 2023-10-26 (×2): 10 mg via INTRAVENOUS
  Administered 2023-10-26: 5 mg via INTRAVENOUS

## 2023-10-26 MED ORDER — DEXAMETHASONE SODIUM PHOSPHATE 10 MG/ML IJ SOLN
INTRAMUSCULAR | Status: DC | PRN
  Administered 2023-10-26: 10 mg via INTRAVENOUS

## 2023-10-26 MED ORDER — SCOPOLAMINE 1 MG/3DAYS TD PT72
MEDICATED_PATCH | TRANSDERMAL | Status: AC
  Filled 2023-10-26: qty 1

## 2023-10-26 MED ORDER — EPHEDRINE SULFATE (PRESSORS) 50 MG/ML IJ SOLN: INTRAMUSCULAR | Status: DC | PRN

## 2023-10-26 MED ORDER — ONDANSETRON HCL 4 MG/2ML IJ SOLN
INTRAMUSCULAR | Status: DC | PRN
  Administered 2023-10-26: 4 mg via INTRAVENOUS

## 2023-10-26 MED ORDER — MIDAZOLAM HCL 2 MG/2ML IJ SOLN: 0.5000 mg | Freq: Once | INTRAMUSCULAR | Status: DC | PRN

## 2023-10-26 MED ORDER — LACTATED RINGERS IV SOLN: INTRAVENOUS | Status: DC

## 2023-10-26 MED ORDER — ACETAMINOPHEN 500 MG PO TABS
1000.0000 mg | ORAL_TABLET | Freq: Once | ORAL | Status: AC
Start: 2023-10-26 — End: 2023-10-26
  Administered 2023-10-26: 1000 mg via ORAL

## 2023-10-26 MED ORDER — EPHEDRINE 5 MG/ML INJ
INTRAVENOUS | Status: AC
  Filled 2023-10-26: qty 5

## 2023-10-26 MED ORDER — PHENYLEPHRINE HCL (PRESSORS) 10 MG/ML IV SOLN
INTRAVENOUS | Status: DC | PRN
  Administered 2023-10-26: 80 ug via INTRAVENOUS

## 2023-10-26 MED ORDER — DEXAMETHASONE SODIUM PHOSPHATE 10 MG/ML IJ SOLN
INTRAMUSCULAR | Status: AC
  Filled 2023-10-26: qty 1

## 2023-10-26 MED ORDER — ACETAMINOPHEN 500 MG PO TABS
ORAL_TABLET | ORAL | Status: AC
  Filled 2023-10-26: qty 2

## 2023-10-26 MED ORDER — ASPIRIN 81 MG PO CHEW: 81.0000 mg | CHEWABLE_TABLET | Freq: Two times a day (BID) | ORAL | 0 refills | Status: AC

## 2023-10-26 MED ORDER — ONDANSETRON HCL 4 MG PO TABS: 4.0000 mg | ORAL_TABLET | Freq: Three times a day (TID) | ORAL | 0 refills | Status: AC | PRN

## 2023-10-26 MED ORDER — CEFAZOLIN SODIUM-DEXTROSE 2-4 GM/100ML-% IV SOLN
2.0000 g | INTRAVENOUS | Status: AC
  Administered 2023-10-26: 2 g via INTRAVENOUS

## 2023-10-26 MED ORDER — PROPOFOL 10 MG/ML IV BOLUS
INTRAVENOUS | Status: DC | PRN
  Administered 2023-10-26: 200 mg via INTRAVENOUS

## 2023-10-26 MED ORDER — MIDAZOLAM HCL 2 MG/2ML IJ SOLN
2.0000 mg | Freq: Once | INTRAMUSCULAR | Status: AC
  Administered 2023-10-26: 2 mg via INTRAVENOUS

## 2023-10-26 MED ORDER — PROPOFOL 10 MG/ML IV BOLUS
INTRAVENOUS | Status: AC
  Filled 2023-10-26: qty 20

## 2023-10-26 MED ORDER — HYDROMORPHONE HCL 1 MG/ML IJ SOLN: 0.2500 mg | INTRAMUSCULAR | Status: DC | PRN

## 2023-10-26 MED ORDER — BUPIVACAINE-EPINEPHRINE (PF) 0.5% -1:200000 IJ SOLN
INTRAMUSCULAR | Status: DC | PRN
Start: 2023-10-26 — End: 2023-10-26
  Administered 2023-10-26: 30 mL via PERINEURAL

## 2023-10-26 MED ORDER — VANCOMYCIN HCL 1 G IV SOLR
INTRAVENOUS | Status: DC | PRN
  Administered 2023-10-26: 1000 mg via TOPICAL

## 2023-10-26 MED ORDER — LIDOCAINE 2% (20 MG/ML) 5 ML SYRINGE
INTRAMUSCULAR | Status: AC
  Filled 2023-10-26: qty 5

## 2023-10-26 MED ORDER — ONDANSETRON HCL 4 MG/2ML IJ SOLN
INTRAMUSCULAR | Status: AC
  Filled 2023-10-26: qty 2

## 2023-10-26 MED ORDER — OXYCODONE HCL 5 MG PO TABS: ORAL_TABLET | ORAL | 0 refills | Status: AC

## 2023-10-26 MED ORDER — MEPERIDINE HCL 25 MG/ML IJ SOLN: 6.2500 mg | INTRAMUSCULAR | Status: DC | PRN

## 2023-10-26 MED ORDER — OXYCODONE HCL 5 MG/5ML PO SOLN: 5.0000 mg | Freq: Once | ORAL | Status: DC | PRN

## 2023-10-26 MED ORDER — ACETAMINOPHEN 500 MG PO TABS: 1000.0000 mg | ORAL_TABLET | Freq: Three times a day (TID) | ORAL | 0 refills | Status: AC

## 2023-10-26 MED ORDER — FENTANYL CITRATE (PF) 100 MCG/2ML IJ SOLN
INTRAMUSCULAR | Status: DC | PRN
  Administered 2023-10-26 (×2): 25 ug via INTRAVENOUS

## 2023-10-26 MED ORDER — PHENYLEPHRINE 80 MCG/ML (10ML) SYRINGE FOR IV PUSH (FOR BLOOD PRESSURE SUPPORT)
PREFILLED_SYRINGE | INTRAVENOUS | Status: AC
  Filled 2023-10-26: qty 10

## 2023-10-26 MED ORDER — SCOPOLAMINE 1 MG/3DAYS TD PT72
1.0000 | MEDICATED_PATCH | Freq: Once | TRANSDERMAL | Status: DC
  Administered 2023-10-26: 1.5 mg via TRANSDERMAL

## 2023-10-26 MED ORDER — GABAPENTIN 300 MG PO CAPS
300.0000 mg | ORAL_CAPSULE | Freq: Once | ORAL | Status: AC
Start: 2023-10-26 — End: 2023-10-26
  Administered 2023-10-26: 300 mg via ORAL

## 2023-10-26 MED ORDER — LIDOCAINE HCL (CARDIAC) PF 100 MG/5ML IV SOSY
PREFILLED_SYRINGE | INTRAVENOUS | Status: DC | PRN
  Administered 2023-10-26: 20 mg via INTRAVENOUS

## 2023-10-26 SURGICAL SUPPLY — 67 items
BANDAGE ESMARK 6X9 LF (GAUZE/BANDAGES/DRESSINGS) IMPLANT
BIT DRILL 2.6 CANN (BIT) IMPLANT
BLADE SURG 10 STRL SS (BLADE) ×1 IMPLANT
BLADE SURG 15 STRL LF DISP TIS (BLADE) ×2 IMPLANT
BNDG ELASTIC 4INX 5YD STR LF (GAUZE/BANDAGES/DRESSINGS) ×1 IMPLANT
BNDG ELASTIC 6INX 5YD STR LF (GAUZE/BANDAGES/DRESSINGS) ×1 IMPLANT
CANISTER SUCT 1200ML W/VALVE (MISCELLANEOUS) IMPLANT
CHLORAPREP W/TINT 26 (MISCELLANEOUS) ×1 IMPLANT
CLSR STERI-STRIP ANTIMIC 1/2X4 (GAUZE/BANDAGES/DRESSINGS) IMPLANT
COOLER ICEMAN CLASSIC (MISCELLANEOUS) ×1 IMPLANT
CUFF TRNQT CYL 34X4.125X (TOURNIQUET CUFF) IMPLANT
DRAPE C-ARM 42X72 X-RAY (DRAPES) ×1 IMPLANT
DRAPE C-ARMOR (DRAPES) ×1 IMPLANT
DRAPE EXTREMITY T 121X128X90 (DISPOSABLE) ×1 IMPLANT
DRAPE IMP U-DRAPE 54X76 (DRAPES) IMPLANT
DRAPE INCISE IOBAN 66X45 STRL (DRAPES) IMPLANT
DRAPE OEC MINIVIEW 54X84 (DRAPES) IMPLANT
DRAPE U-SHAPE 47X51 STRL (DRAPES) ×1 IMPLANT
DRSG MEPILEX POST OP 4X8 (GAUZE/BANDAGES/DRESSINGS) ×1 IMPLANT
ELECTRODE REM PT RTRN 9FT ADLT (ELECTROSURGICAL) ×1 IMPLANT
FIBER TAPE 2MM (SUTURE) IMPLANT
FIBERTAPE 2 W/STRL NDL 17 (SUTURE) IMPLANT
GAUZE PAD ABD 8X10 STRL (GAUZE/BANDAGES/DRESSINGS) IMPLANT
GAUZE SPONGE 4X4 12PLY STRL (GAUZE/BANDAGES/DRESSINGS) ×1 IMPLANT
GAUZE XEROFORM 1X8 LF (GAUZE/BANDAGES/DRESSINGS) IMPLANT
GLOVE BIO SURGEON STRL SZ 6.5 (GLOVE) ×1 IMPLANT
GLOVE BIOGEL PI IND STRL 6.5 (GLOVE) ×1 IMPLANT
GLOVE BIOGEL PI IND STRL 8 (GLOVE) ×1 IMPLANT
GLOVE ECLIPSE 8.0 STRL XLNG CF (GLOVE) ×1 IMPLANT
GLOVE SURG SYN 8.0 (GLOVE) ×2 IMPLANT
GLOVE SURG SYN 8.0 PF PI (GLOVE) IMPLANT
GOWN STRL REUS W/ TWL LRG LVL3 (GOWN DISPOSABLE) ×1 IMPLANT
GOWN STRL REUS W/TWL XL LVL3 (GOWN DISPOSABLE) ×1 IMPLANT
GUIDEWIRE 1.35MM DUAL TROCAR (WIRE) IMPLANT
IMMOBILIZER KNEE 22 UNIV (SOFTGOODS) IMPLANT
IMMOBILIZER KNEE 24 THIGH 36 (MISCELLANEOUS) IMPLANT
IMMOBILIZER KNEE 24 UNIV (MISCELLANEOUS) IMPLANT
MANIFOLD NEPTUNE II (INSTRUMENTS) IMPLANT
NDL KEITH (NEEDLE) IMPLANT
NDL MAYO 6 CRC TAPER PT (NEEDLE) IMPLANT
NDL SUT 6 .5 CRC .975X.05 MAYO (NEEDLE) IMPLANT
NEEDLE KEITH (NEEDLE) IMPLANT
NEEDLE MAYO 6 CRC TAPER PT (NEEDLE) IMPLANT
NS IRRIG 1000ML POUR BTL (IV SOLUTION) ×1 IMPLANT
PACK ARTHROSCOPY DSU (CUSTOM PROCEDURE TRAY) ×1 IMPLANT
PACK BASIN DAY SURGERY FS (CUSTOM PROCEDURE TRAY) ×1 IMPLANT
PAD CAST 4YDX4 CTTN HI CHSV (CAST SUPPLIES) IMPLANT
PAD COLD SHLDR WRAP-ON (PAD) ×1 IMPLANT
PADDING CAST COTTON 6X4 STRL (CAST SUPPLIES) IMPLANT
PENCIL SMOKE EVACUATOR (MISCELLANEOUS) ×1 IMPLANT
SCREW CANN 4X34 LO PRO (Screw) IMPLANT
SCREW CANN BLUNT TIP 4X38 LP (Screw) IMPLANT
SLEEVE SCD COMPRESS KNEE MED (STOCKING) IMPLANT
SPIKE FLUID TRANSFER (MISCELLANEOUS) IMPLANT
SPLINT PLASTER CAST FAST 5X30 (CAST SUPPLIES) IMPLANT
SPONGE T-LAP 18X18 ~~LOC~~+RFID (SPONGE) ×1 IMPLANT
SUT ETHILON 3 0 PS 1 (SUTURE) IMPLANT
SUT MNCRL AB 4-0 PS2 18 (SUTURE) ×1 IMPLANT
SUT VIC AB 0 CT1 27XBRD ANBCTR (SUTURE) IMPLANT
SUT VIC AB 1 CT1 27XBRD ANBCTR (SUTURE) IMPLANT
SUT VIC AB 3-0 SH 27X BRD (SUTURE) ×1 IMPLANT
SUT VIC AB PLUS 45CM 1-MO-4 (SUTURE) IMPLANT
SUTURE FIBERWR #2 38 T-5 BLUE (SUTURE) IMPLANT
SUTURE FIBERWR #5 38 CONV NDL (SUTURE) IMPLANT
SYR BULB EAR ULCER 3OZ GRN STR (SYRINGE) IMPLANT
TOWEL GREEN STERILE FF (TOWEL DISPOSABLE) ×3 IMPLANT
TUBE SUCTION HIGH CAP CLEAR NV (SUCTIONS) ×1 IMPLANT

## 2023-10-26 NOTE — Anesthesia Procedure Notes (Signed)
 Procedure Name: LMA Insertion Date/Time: 10/26/2023 10:56 AM  Performed by: Noralyn Beams, CRNAPre-anesthesia Checklist: Patient identified, Emergency Drugs available, Suction available and Patient being monitored Patient Re-evaluated:Patient Re-evaluated prior to induction Oxygen Delivery Method: Circle system utilized Preoxygenation: Pre-oxygenation with 100% oxygen Induction Type: IV induction Ventilation: Mask ventilation without difficulty LMA: LMA inserted LMA Size: 4.0 Number of attempts: 1 Airway Equipment and Method: Bite block Placement Confirmation: positive ETCO2 Tube secured with: Tape Dental Injury: Teeth and Oropharynx as per pre-operative assessment

## 2023-10-26 NOTE — Progress Notes (Signed)
 Assisted Dr. Jonne Netters with right, femoral, ultrasound guided block. Side rails up, monitors on throughout procedure. See vital signs in flow sheet. Tolerated Procedure well.

## 2023-10-26 NOTE — Interval H&P Note (Signed)
 All questions answered, patient wants to proceed with procedure. ? ?

## 2023-10-26 NOTE — Op Note (Signed)
 Orthopaedic Surgery Operative Note (CSN: [id number])  [name]  01-14-1966 Date of Surgery: 10/26/2023   Diagnoses:  displaced fracture of right patella  Procedure: Right patella open reduction internal fixation Right medial retinacular repair Right lateral retinacular repair   Operative Finding Successful completion of the planned procedure.  Patient had extremely displaced fracture with tears of the medial lateral retinaculum.  The patella was about 10 to 12 cm proximally displaced.  It was Tala transverse fracture in the proximal half of the patella however there was still bone for appropriate reduction.  We performed open reduction internal fixation with screws as well as Lucindia tension band and Dekisha cerclage suture around the periphery of the patella.  Medial and lateral retinaculum were closed separately.  Post-operative plan: The patient will be weightbearing to tolerance in Lorinda hinged knee brace locked in extension.  The patient will be discharged home.  DVT prophylaxis Aspirin  81 mg twice daily for 6 weeks.   Pain control with PRN pain medication preferring oral medicines.  Follow up plan will be scheduled in approximately 7 days for incision check and XR.  Post-Op Diagnosis: Same Surgeons:Primary: Micheline Ahr, MD Assistants:Caroline McBane, PA-C and Rozanna Corner, RNFA Location: [facility] OR ROOM 6 Anesthesia: General with regional anesthesia Antibiotics: Ancef  2 g with local vancomycin  powder 1 g at the surgical site Tourniquet time:  Estimated Blood Loss: 50 Complications: None Specimens: None Implants: Implant Name Type Inv. Item Serial No. Manufacturer Lot No. LRB No. Used Action  SCREW CANN BLUNT TIP 4X38 LP - [id number] Screw SCREW CANN BLUNT TIP 4X38 LP  [employer] STERILE ON SET Right 2 Implanted  SCREW CANN 4X34 LO PRO - [id number] Screw SCREW CANN 4X34 LO PRO  [employer] STERILE ON SET Right 1 Implanted    Indications for Surgery:   Pamela. [name] is Murna 58 y.o. female with fall  resulting in Pamela [name] extremely displaced patella fracture.  Benefits and risks of operative and nonoperative management were discussed prior to surgery with patient/guardian(s) and informed consent form was completed.  Specific risks including infection, need for additional surgery, nonunion, malunion, loss of reduction, stiffness amongst others.   Procedure:   The patient was identified properly. Informed consent was obtained and the surgical site was marked. The patient was taken up to suite where general anesthesia was induced.  The patient was positioned prone on Arnesia regular bed.  The right knee was prepped and draped in the usual sterile fashion.  Timeout was performed before the beginning of the case.  We made Siham medial parapatellar incision dissecting sharply through the skin overlying the patella.  We encountered the fascia overlying patella and the retinaculum and immediately noted fracture as well as tears in the medial lateral retinaculum and obvious hematoma.  Carried our dissection subcutaneously flaps exposing the retinacular tears achieving hemostasis as we progressed.  Transverse patella fracture with significant displacement and proximal migration of the proximal fragment.  We were able to excisionally debride hematoma from the fracture site and clear soft tissue to this facilitate and reduction.  At this point Alysah combination of Weber clamps as well as point-to-point reduction clamps were used to hold anatomic reduction which was checked on fluoroscopy.  We prioritized the articular reduction.  We were able to palpate the articular surface to verify that it was indeed anatomically reduced through the retinacular tears.  This point we used fluoroscopy to place 2 K wires from the Arthrex patella fracture set checking position on orthogonal fluoroscopy.  Were happy with her position and proceeded to place 2 partially threaded screws that were appropriately measured.  We again checked our reduction  and screw length and placement using fluoroscopy.  Were happy with our compression at the fracture site.  Reduction was anatomic with good compression of the fracture site.  This point we used fiber tape and placed in Kaizley tension band fashion through the screws and tied this down with good tension on her sutures.  We then placed Liyla cerclage suture of fiber tape around the outside of the patella itself and again tied this down.   There were complete ruptures of the medial and lateral retinaculum part of why the patella displaced so far.  We used extensive amount of time to repair both the medial and lateral retinaculum with interrupted sutures.  At this point the incision was thoroughly irrigated and 1 g of vancomycin  powder was placed locally before the retinacular repair was performed with multiple #1 Vicryl pops as well as #2 FiberWire.  Any rents in the tendon were closed with Vicryl from placement of screws.     We irrigated the wound copiously before placing local antibiotic as listed above.  We closed the incision in Avanti multilayer fashion with nylon suture.  Sterile dressing was placed.  Patient was awoken taken to PACU in stable condition.  Nicholas Bari, PA-C, present and scrubbed throughout the case, critical for completion in Reagyn timely fashion, and for retraction, instrumentation, closure.

## 2023-10-26 NOTE — Transfer of Care (Signed)
 Immediate Anesthesia Transfer of Care Note  Patient: Pamela [name]  Procedure(s) Performed: OPEN REDUCTION INTERNAL FIXATION (ORIF) RIGHT PATELLA (Right: Knee)  Patient Location: PACU  Anesthesia Type:General  Level of Consciousness: awake, alert , and oriented  Airway & Oxygen Therapy: Patient Spontanous Breathing and Patient connected to face mask oxygen  Post-op Assessment: Report given to RN and Post -op Vital signs reviewed and stable  Post vital signs: Reviewed and stable  Last Vitals:  Vitals Value Taken Time  BP 127/62 10/26/23 1205  Temp    Pulse 66 10/26/23 1208  Resp 15 10/26/23 1208  SpO2 100 % 10/26/23 1208  Vitals shown include unfiled device data.  Last Pain:  Vitals:   10/26/23 0935  TempSrc: Temporal  PainSc: 1          Complications: No notable events documented.

## 2023-10-26 NOTE — Anesthesia Preprocedure Evaluation (Addendum)
 Anesthesia Evaluation  Patient identified by MRN, date of birth, ID band Patient awake    Reviewed: Allergy & Precautions, NPO status , Patient's Chart, lab work & pertinent test results  History of Anesthesia Complications Negative for: history of anesthetic complications  Airway Mallampati: I  TM Distance: >3 FB Neck ROM: Full    Dental  (+) Dental Advisory Given, Teeth Intact   Pulmonary neg pulmonary ROS   breath sounds clear to auscultation       Cardiovascular (-) angina negative cardio ROS  Rhythm:Regular Rate:Normal     Neuro/Psych  Headaches  Anxiety Depression       GI/Hepatic negative GI ROS, Neg liver ROS,,,  Endo/Other  negative endocrine ROS    Renal/GU negative Renal ROS     Musculoskeletal   Abdominal   Peds  Hematology negative hematology ROS (+)   Anesthesia Other Findings   Reproductive/Obstetrics                             Anesthesia Physical Anesthesia Plan  ASA: 2  Anesthesia Plan: General   Post-op Pain Management: Tylenol  PO (pre-op)* and Regional block*   Induction: Intravenous  PONV Risk Score and Plan: 3 and Ondansetron , Dexamethasone  and Scopolamine patch - Pre-op  Airway Management Planned: LMA  Additional Equipment: None  Intra-op Plan:   Post-operative Plan:   Informed Consent: I have reviewed the patients History and Physical, chart, labs and discussed the procedure including the risks, benefits and alternatives for the proposed anesthesia with the patient or authorized representative who has indicated his/her understanding and acceptance.     Dental advisory given  Plan Discussed with: CRNA and Surgeon  Anesthesia Plan Comments: (Plan routine monitors, GA with femoral nerve block for post op analgesia)        Anesthesia Quick Evaluation

## 2023-10-26 NOTE — Anesthesia Postprocedure Evaluation (Signed)
 Anesthesia Post Note  Patient: Pamela [name]  Procedure(s) Performed: OPEN REDUCTION INTERNAL FIXATION (ORIF) RIGHT PATELLA (Right: Knee)     Patient location during evaluation: PACU Anesthesia Type: General Level of consciousness: awake and alert, oriented and patient cooperative Pain management: pain level controlled Vital Signs Assessment: post-procedure vital signs reviewed and stable Respiratory status: spontaneous breathing, nonlabored ventilation and respiratory function stable Cardiovascular status: blood pressure returned to baseline and stable Postop Assessment: no apparent nausea or vomiting and adequate PO intake Anesthetic complications: no   No notable events documented.  Last Vitals:  Vitals:   10/26/23 1253 10/26/23 1313  BP:  132/61  Pulse: 65 65  Resp: 11 16  Temp:  36.5 C  SpO2: 98% 100%    Last Pain:  Vitals:   10/26/23 1313  TempSrc: Temporal  PainSc: 0-No pain        RLE Motor Response: Purposeful movement (10/26/23 1315) RLE Sensation: Full sensation (10/26/23 1315)      Hayli Milligan,E. Adyan Palau

## 2023-10-26 NOTE — Anesthesia Procedure Notes (Signed)
 Anesthesia Regional Block: Femoral nerve block   Pre-Anesthetic Checklist: , timeout performed,  Correct Patient, Correct Site, Correct Laterality,  Correct Procedure, Correct Position, site marked,  Risks and benefits discussed,  Surgical consent,  Pre-op evaluation,  At surgeon's request and post-op pain management  Laterality: Right and Lower  Prep: chloraprep       Needles:  Injection technique: Single-shot  Needle Type: Echogenic Needle     Needle Length: 9cm  Needle Gauge: 21     Additional Needles:   Procedures:,,,, ultrasound used (permanent image in chart),,    Narrative:  Start time: 10/26/2023 10:27 AM End time: 10/26/2023 10:33 AM Injection made incrementally with aspirations every 5 mL.  Performed by: Personally  Anesthesiologist: Jonne Netters, MD  Additional Notes: Pt identified in Holding room.  Monitors applied. Working IV access confirmed. Timeout, Sterile prep R groin.  #21ga ECHOgenic Arrow block needle to femoral nerve with US  guidance.  30cc 0.5% Bupivacaine  1:200k epi injected incrementally after negative test dose.  Patient asymptomatic, VSS, no heme aspirated, tolerated well.   Fay Hoop, MD

## 2023-10-27 ENCOUNTER — Encounter (HOSPITAL_BASED_OUTPATIENT_CLINIC_OR_DEPARTMENT_OTHER): Payer: Self-pay | Admitting: Orthopaedic Surgery

## 2024-02-02 ENCOUNTER — Encounter: Payer: Self-pay | Admitting: Family Medicine

## 2024-02-02 ENCOUNTER — Ambulatory Visit: Admitting: Family Medicine

## 2024-02-02 VITALS — BP 122/80 | HR 62 | Temp 98.4°F | Ht 69.0 in | Wt 151.2 lb

## 2024-02-02 DIAGNOSIS — M47816 Spondylosis without myelopathy or radiculopathy, lumbar region: Secondary | ICD-10-CM | POA: Diagnosis not present

## 2024-02-02 DIAGNOSIS — R102 Pelvic and perineal pain: Secondary | ICD-10-CM

## 2024-02-02 MED ORDER — LIDOCAINE 5 % EX PTCH: 1.0000 | MEDICATED_PATCH | CUTANEOUS | 5 refills | Status: AC

## 2024-02-02 NOTE — Progress Notes (Addendum)
 Patient Office Visit  Assessment & Plan:  Facet syndrome, lumbar -     Ambulatory referral to Physical Therapy -     Lidocaine ; Place 1 patch onto the skin daily. Remove & Discard patch within 12 hours or as directed by MD  Dispense: 30 patch; Refill: 5  Pelvic pain   Assessment and Plan    Low back pain with radicular symptoms Chronic low back pain with radicular symptoms, likely due to nerve impingement. Gabapentin  caused adverse effects. Ice and OTC patches used for pain. Discussed Cymbalta as an alternative with noted concerns about side effects. - Order physical therapy at [facility]. - Prescribe Lidoderm  patches, send prescription to [facility] at [facility]. - Discussed Cymbalta for pain management; sheis to read about it and consider.  Pelvic pain Pelvic pain possibly related to fibroids. No previous fibroid diagnosis. Uncomfortable with transvaginal ultrasound unless by female provider. No vaginal bleeding reported. - will consider ordering transvaginal ultrasound to check for fibroids, specify preference for female provider. patient will let us  know about this - Provide information on imaging locations, including [facility] and [facility].     Addendum- patient does not want AI scribe to be utilized in the future. Will make sure this does not occur in future appointments.      No follow-ups on file.   Subjective:    Patient ID: [name], female    DOB: March 19, 1966  Age: 58 y.o. MRN: [id number]  Chief Complaint  Patient presents with   Medical Management of Chronic Issues    HPI Discussed the use of AI scribe software for clinical note transcription with the patient, who gave verbal consent to proceed.  History of Present Illness        Pamela [name] is Pamela [name] 58 year old female who presents with lower back pain and lower pelvic pain.  She experiences significant lower back pain, which she believes may be related to Pamela [name] previous knee injury.  The pain is severe, particularly at night, making it difficult for her to sleep. She uses ice packs and over-the-counter patches for relief, but the pain persists during the day as well. The back pain sometimes radiates down her right leg. She has not been taking any prescription medication for the pain recently, as previous use of gabapentin  caused adverse effects such as sweating and feeling 'weird'. She tried both 100mg  and 300mg  dosage.   She has Pamela [name] history of Pamela [name] right patella fracture that occurred in an accident at her second job in May. The injury required surgery and has significantly impacted her mobility, as she is only now able to walk without Pamela [name] cane but still struggles with stairs. This injury also necessitated time off from both her main and second jobs.  She expresses concern about fibroids potentially contributing to her back pain, although she has not been diagnosed with fibroids. No abnormal vaginal bleeding is reported. patient does have pelvic pain on and off.  patient is not sure she can do [name] transvaginal ultrasound due to previous discomfort with pelvic exams.   She is not currently taking Wellbutrin, having stopped four months ago, and reports feeling okay without it. She has not had COVID-19 vaccine this season (or infection) and is up to date on her colonoscopy but has not had Pamela [name] mammogram Physical Exam MUSCULOSKELETAL: Tenderness in the middle lower back at L2. Results Assessment & Plan Low back pain with radicular symptoms Chronic low back pain with radicular symptoms, likely due to  nerve impingement. Gabapentin  caused adverse effects. Ice and OTC patches used for pain. Discussed Cymbalta as an alternative with noted concerns about side effects. - Order physical therapy at [facility]. - Prescribe Lidoderm  patches, send prescription to [facility] at [facility]. - Discussed Cymbalta for pain management; sheis to read about it and consider.  Pelvic pain Pelvic pain  possibly related to fibroids. No previous fibroid diagnosis. Uncomfortable with transvaginal ultrasound unless by female provider. No vaginal bleeding reported. - will consider ordering transvaginal ultrasound to check for fibroids, specify preference for female provider. patient will let us  know about this - Provide information on imaging locations, including [facility] and [facility].    The 10-year ASCVD risk score (Arnett DK, et al., 2019) is: 1.4%  Past Medical History:  Diagnosis Date   Adjustment disorder with depressed mood    Anxiety    Chronic right-sided low back pain without sciatica    Closed patellar sleeve fracture of right knee 2025   Complication of anesthesia    woke up during elbow surgery 2021   Depression    Facet syndrome, lumbar    HSIL (high grade squamous intraepithelial lesion) on Pap smear of cervix    Migraine without aura and without status migrainosus, not intractable    Vitamin D deficiency    Past Surgical History:  Procedure Laterality Date   COLONOSCOPY WITH PROPOFOL   09/25/2017   by dr shila   HARDWARE REMOVAL Left 2021   @[facility] ;   left elbow   LEEP N/Pamela [name] 10/19/2021   Procedure: LOOP ELECTROSURGICAL EXCISION PROCEDURE WITH COLPOSCOPY;  Surgeon: Jannis Kate Norris, MD;  Location: [facility];  Service: Gynecology;  Laterality: N/Tascha;   ORIF ELBOW FRACTURE Left 07/10/2019   Procedure: OPEN REDUCTION INTERNAL FIXATION (ORIF) LEFT ELBOW/OLECRANON FRACTURE;  Surgeon: Cristy Bonner DASEN, MD;  Location: [facility];  Service: Orthopedics;  Laterality: Left;   ORIF PATELLA Right 10/26/2023   Procedure: OPEN REDUCTION INTERNAL FIXATION (ORIF) RIGHT PATELLA;  Surgeon: Cristy Bonner DASEN, MD;  Location: [facility];  Service: Orthopedics;  Laterality: Right;   Social History   Tobacco Use   Smoking status: Never   Smokeless tobacco: Never  Vaping Use   Vaping status: Never Used  Substance Use Topics   Alcohol use:  No    Alcohol/week: 0.0 standard drinks of alcohol   Drug use: Never   Family History  Problem Relation Age of Onset   Migraines Mother    Fibroids Mother    Heart disease Paternal Grandfather    Colon cancer Neg Hx    Esophageal cancer Neg Hx    Stomach cancer Neg Hx    Cervical cancer Neg Hx    Ovarian cancer Neg Hx    Breast cancer Neg Hx    Allergies  Allergen Reactions   Doxycycline Monohydrate Hives   Erythromycin Hives   Other     Cycline-250=hives   Sulfa Antibiotics Hives    ROS    Objective:    BP 122/80   Pulse 62   Temp 98.4 F (36.9 C)   Ht 5' 9 (1.753 m)   Wt 151 lb 4 oz (68.6 kg)   SpO2 98%   BMI 22.34 kg/m  BP Readings from Last 3 Encounters:  02/02/24 122/80  10/26/23 132/61  10/24/23 (!) 160/94   Wt Readings from Last 3 Encounters:  02/02/24 151 lb 4 oz (68.6 kg)  10/26/23 142 lb 13.7 oz (64.8 kg)  10/24/23  145 lb 8.1 oz (66 kg)    Physical Exam Vitals and nursing note reviewed.  Constitutional:      Appearance: Normal appearance.  HENT:     Head: Normocephalic.     Right Ear: Tympanic membrane, ear canal and external ear normal.     Left Ear: Tympanic membrane, ear canal and external ear normal.  Eyes:     Extraocular Movements: Extraocular movements intact.     Conjunctiva/sclera: Conjunctivae normal.     Pupils: Pupils are equal, round, and reactive to light.  Cardiovascular:     Rate and Rhythm: Normal rate and regular rhythm.     Heart sounds: Normal heart sounds.  Pulmonary:     Effort: Pulmonary effort is normal.     Breath sounds: Normal breath sounds.  Musculoskeletal:     Lumbar back: Tenderness and bony tenderness present. Negative right straight leg raise test and negative left straight leg raise test.     Right lower leg: No edema.     Left lower leg: No edema.     Comments: Middle of lower back area, FROM - can touch her toes  Neurological:     General: No focal deficit present.     Mental Status: She is alert  and oriented to person, place, and time.  Psychiatric:        Mood and Affect: Mood normal.        Behavior: Behavior normal.        Thought Content: Thought content normal.        Judgment: Judgment normal.      No results found for any visits on 02/02/24.

## 2024-02-03 ENCOUNTER — Encounter: Payer: Self-pay | Admitting: Family Medicine

## 2024-02-05 ENCOUNTER — Other Ambulatory Visit: Payer: Self-pay

## 2024-02-05 ENCOUNTER — Encounter: Payer: Self-pay | Admitting: Family Medicine

## 2024-02-05 DIAGNOSIS — Z23 Encounter for immunization: Secondary | ICD-10-CM

## 2024-02-05 MED ORDER — COVID-19 MRNA VAC-TRIS(PFIZER) 30 MCG/0.3ML IM SUSY: 0.3000 mL | PREFILLED_SYRINGE | Freq: Once | INTRAMUSCULAR | 0 refills | Status: DC

## 2024-02-05 MED ORDER — COVID-19 MRNA VACC (MODERNA) 50 MCG/0.5ML IM SUSY: 0.5000 mL | PREFILLED_SYRINGE | Freq: Once | INTRAMUSCULAR | 0 refills | Status: AC

## 2024-02-06 ENCOUNTER — Telehealth: Payer: Self-pay | Admitting: Pharmacy Technician

## 2024-02-06 ENCOUNTER — Other Ambulatory Visit (HOSPITAL_COMMUNITY): Payer: Self-pay

## 2024-02-06 NOTE — Telephone Encounter (Signed)
 Pharmacy Patient Advocate Encounter  Received notification from [facility] that Prior Authorization for Lidocaine  5% patches  has been DENIED.  Full denial letter will be uploaded to the media tab. See denial reason below.     PA #/Case ID/Reference #: [id number]

## 2024-02-06 NOTE — Telephone Encounter (Signed)
 Pharmacy Patient Advocate Encounter   Received notification from Onbase that prior authorization for Lidocaine  5% patches  is required/requested.   Insurance verification completed.   The patient is insured through [facility] .   Per test claim: PA required; PA submitted to above mentioned insurance via Latent Key/confirmation #/[id number] Status is pending

## 2024-02-15 ENCOUNTER — Other Ambulatory Visit (HOSPITAL_COMMUNITY): Payer: Self-pay

## 2024-02-16 ENCOUNTER — Other Ambulatory Visit (HOSPITAL_COMMUNITY): Payer: Self-pay

## 2024-02-16 MED ORDER — GABAPENTIN 100 MG PO CAPS: 100.0000 mg | ORAL_CAPSULE | Freq: Every day | ORAL | 1 refills | Status: AC

## 2024-02-16 MED ORDER — MELOXICAM 7.5 MG PO TABS
7.5000 mg | ORAL_TABLET | Freq: Two times a day (BID) | ORAL | 2 refills | Status: AC
  Filled 2024-02-16: qty 180, 90d supply, fill #0

## 2024-02-16 MED ORDER — CYCLOBENZAPRINE HCL 5 MG PO TABS
5.0000 mg | ORAL_TABLET | Freq: Three times a day (TID) | ORAL | 1 refills | Status: AC | PRN
  Filled 2024-04-29: qty 30, 10d supply, fill #0

## 2024-02-16 MED ORDER — COMIRNATY 30 MCG/0.3ML IM SUSY: 0.3000 mL | PREFILLED_SYRINGE | Freq: Once | INTRAMUSCULAR | 0 refills | Status: AC

## 2024-02-16 MED ORDER — ACETAMINOPHEN 500 MG PO PACK: 1000.0000 mg | PACK | Freq: Three times a day (TID) | ORAL | 1 refills | Status: AC

## 2024-04-29 ENCOUNTER — Encounter: Payer: Self-pay | Admitting: Family Medicine

## 2024-04-29 ENCOUNTER — Other Ambulatory Visit (HOSPITAL_COMMUNITY): Payer: Self-pay

## 2024-05-04 ENCOUNTER — Other Ambulatory Visit (HOSPITAL_COMMUNITY): Payer: Self-pay

## 2024-05-15 ENCOUNTER — Other Ambulatory Visit (HOSPITAL_COMMUNITY): Payer: Self-pay

## 2024-05-15 MED ORDER — COMIRNATY 30 MCG/0.3ML IM SUSY
0.3000 mL | PREFILLED_SYRINGE | Freq: Once | INTRAMUSCULAR | 0 refills | Status: AC
  Filled 2024-05-15: qty 0.3, 1d supply, fill #0
